# Patient Record
Sex: Female | Born: 1965 | Race: White | Hispanic: No | Marital: Married | Smoking: Former smoker
Health system: Southern US, Community
[De-identification: ages and names within clinical notes are randomized; demographics above are authoritative.]

## PROBLEM LIST (undated history)

## (undated) DIAGNOSIS — Z87442 Personal history of urinary calculi: Secondary | ICD-10-CM

## (undated) DIAGNOSIS — Z1589 Genetic susceptibility to other disease: Secondary | ICD-10-CM

## (undated) DIAGNOSIS — Z8042 Family history of malignant neoplasm of prostate: Secondary | ICD-10-CM

## (undated) DIAGNOSIS — N200 Calculus of kidney: Secondary | ICD-10-CM

## (undated) DIAGNOSIS — Z8481 Family history of carrier of genetic disease: Secondary | ICD-10-CM

## (undated) DIAGNOSIS — I10 Essential (primary) hypertension: Secondary | ICD-10-CM

## (undated) DIAGNOSIS — Z803 Family history of malignant neoplasm of breast: Secondary | ICD-10-CM

## (undated) DIAGNOSIS — D259 Leiomyoma of uterus, unspecified: Secondary | ICD-10-CM

## (undated) DIAGNOSIS — Z807 Family history of other malignant neoplasms of lymphoid, hematopoietic and related tissues: Secondary | ICD-10-CM

## (undated) DIAGNOSIS — F419 Anxiety disorder, unspecified: Secondary | ICD-10-CM

## (undated) DIAGNOSIS — K219 Gastro-esophageal reflux disease without esophagitis: Secondary | ICD-10-CM

## (undated) HISTORY — DX: Family history of malignant neoplasm of prostate: Z80.42

## (undated) HISTORY — DX: Family history of carrier of genetic disease: Z84.81

## (undated) HISTORY — PX: APPENDECTOMY: SHX54

## (undated) HISTORY — DX: Family history of other malignant neoplasms of lymphoid, hematopoietic and related tissues: Z80.7

## (undated) HISTORY — DX: Family history of malignant neoplasm of breast: Z80.3

---

## 1992-11-20 HISTORY — PX: APPENDECTOMY: SHX54

## 1999-05-03 ENCOUNTER — Emergency Department (HOSPITAL_COMMUNITY): Admission: EM | Admit: 1999-05-03 | Discharge: 1999-05-03 | Payer: Self-pay | Admitting: Emergency Medicine

## 1999-05-03 ENCOUNTER — Encounter: Payer: Self-pay | Admitting: Emergency Medicine

## 2001-06-27 ENCOUNTER — Encounter: Payer: Self-pay | Admitting: Emergency Medicine

## 2001-06-27 ENCOUNTER — Emergency Department (HOSPITAL_COMMUNITY): Admission: EM | Admit: 2001-06-27 | Discharge: 2001-06-27 | Payer: Self-pay | Admitting: Emergency Medicine

## 2001-11-20 HISTORY — PX: TUBAL LIGATION: SHX77

## 2002-10-30 ENCOUNTER — Ambulatory Visit (HOSPITAL_BASED_OUTPATIENT_CLINIC_OR_DEPARTMENT_OTHER): Admission: RE | Admit: 2002-10-30 | Discharge: 2002-10-30 | Payer: Self-pay | Admitting: Obstetrics and Gynecology

## 2008-05-10 ENCOUNTER — Emergency Department (HOSPITAL_COMMUNITY): Admission: EM | Admit: 2008-05-10 | Discharge: 2008-05-10 | Payer: Self-pay | Admitting: Emergency Medicine

## 2008-12-10 ENCOUNTER — Encounter: Admission: RE | Admit: 2008-12-10 | Discharge: 2008-12-10 | Payer: Self-pay | Admitting: Obstetrics and Gynecology

## 2008-12-15 ENCOUNTER — Encounter: Admission: RE | Admit: 2008-12-15 | Discharge: 2008-12-15 | Payer: Self-pay | Admitting: Obstetrics and Gynecology

## 2009-05-27 ENCOUNTER — Encounter: Admission: RE | Admit: 2009-05-27 | Discharge: 2009-05-27 | Payer: Self-pay | Admitting: Obstetrics and Gynecology

## 2009-08-06 ENCOUNTER — Encounter: Admission: RE | Admit: 2009-08-06 | Discharge: 2009-08-06 | Payer: Self-pay | Admitting: Family Medicine

## 2009-08-07 ENCOUNTER — Emergency Department (HOSPITAL_COMMUNITY): Admission: EM | Admit: 2009-08-07 | Discharge: 2009-08-07 | Payer: Self-pay | Admitting: Emergency Medicine

## 2009-12-13 ENCOUNTER — Encounter: Admission: RE | Admit: 2009-12-13 | Discharge: 2009-12-13 | Payer: Self-pay | Admitting: Obstetrics and Gynecology

## 2010-12-15 ENCOUNTER — Encounter
Admission: RE | Admit: 2010-12-15 | Discharge: 2010-12-15 | Payer: Self-pay | Source: Home / Self Care | Attending: Obstetrics and Gynecology | Admitting: Obstetrics and Gynecology

## 2011-02-24 LAB — DIFFERENTIAL
Basophils Absolute: 0 10*3/uL (ref 0.0–0.1)
Lymphocytes Relative: 15 % (ref 12–46)
Monocytes Absolute: 0.5 10*3/uL (ref 0.1–1.0)
Monocytes Relative: 10 % (ref 3–12)
Neutro Abs: 4.2 10*3/uL (ref 1.7–7.7)
Neutrophils Relative %: 74 % (ref 43–77)

## 2011-02-24 LAB — URINALYSIS, ROUTINE W REFLEX MICROSCOPIC
Bilirubin Urine: NEGATIVE
Hgb urine dipstick: NEGATIVE
Ketones, ur: NEGATIVE mg/dL
Protein, ur: NEGATIVE mg/dL
Specific Gravity, Urine: 1.039 — ABNORMAL HIGH (ref 1.005–1.030)
pH: 6 (ref 5.0–8.0)

## 2011-02-24 LAB — CBC
HCT: 37.7 % (ref 36.0–46.0)
MCV: 91.5 fL (ref 78.0–100.0)
Platelets: 195 10*3/uL (ref 150–400)
RDW: 12.2 % (ref 11.5–15.5)

## 2011-02-24 LAB — COMPREHENSIVE METABOLIC PANEL
Albumin: 3.2 g/dL — ABNORMAL LOW (ref 3.5–5.2)
BUN: 12 mg/dL (ref 6–23)
Creatinine, Ser: 0.5 mg/dL (ref 0.4–1.2)
Glucose, Bld: 98 mg/dL (ref 70–99)
Total Bilirubin: 0.3 mg/dL (ref 0.3–1.2)
Total Protein: 5.9 g/dL — ABNORMAL LOW (ref 6.0–8.3)

## 2011-04-07 NOTE — Op Note (Signed)
   NAME:  ALLEYAH, TWOMBLY                     ACCOUNT NO.:  [id number]   MEDICAL RECORD NO.:  [id number]                   PATIENT TYPE:  AMB   LOCATION:  [facility]                                 FACILITY:  [facility]   PHYSICIAN:  Katherine Roan, M.D.               DATE OF BIRTH:  [birth year]   DATE OF PROCEDURE:  10/30/2002  DATE OF DISCHARGE:                                 OPERATIVE REPORT   PREOPERATIVE DIAGNOSES:  Desires sterilization.   POSTOPERATIVE DIAGNOSES:  Desires sterilization.   OPERATION:  Laparoscopic tubal ligation.   DESCRIPTION OF PROCEDURE:  The patient was prepped and draped in the usual  fashion, the bladder was empted, a transverse incision was made in the  abdomen and a Veress needle was inserted and aspiration infusion was begun  without difficulty. The Hulka elevator was inserted into the abdomen. After  about 3 liters of CO2 were infused, the trocar was inserted, visualization  of the pelvis revealed two normal tubes and ovaries. The second puncture was  made in the lower midline and I cauterized both tubes and scissored them.  Gas was evacuated and the skin was closed with #0 Vicryl on a UR-6 needle  and 4-0 PDS. The incisions were infiltrated with 0.5% Marcaine with  epinephrine. The patient tolerated this procedure well and was sent to the  recovery room in good condition.                                               Katherine Roan, M.D.    SDM/MEDQ  D:  10/30/2002  T:  10/30/2002  Job:  [id number]

## 2011-11-28 ENCOUNTER — Other Ambulatory Visit: Payer: Self-pay | Admitting: Gynecology

## 2011-11-28 DIAGNOSIS — Z1231 Encounter for screening mammogram for malignant neoplasm of breast: Secondary | ICD-10-CM

## 2011-12-20 ENCOUNTER — Ambulatory Visit: Payer: Self-pay

## 2012-01-29 ENCOUNTER — Ambulatory Visit: Payer: Self-pay

## 2012-02-05 ENCOUNTER — Ambulatory Visit
Admission: RE | Admit: 2012-02-05 | Discharge: 2012-02-05 | Disposition: A | Discharge status: Home / Self Care | Payer: BC Managed Care – PPO | Source: Ambulatory Visit | Attending: Gynecology | Admitting: Gynecology

## 2012-02-05 DIAGNOSIS — Z1231 Encounter for screening mammogram for malignant neoplasm of breast: Secondary | ICD-10-CM

## 2012-02-07 ENCOUNTER — Other Ambulatory Visit: Payer: Self-pay | Admitting: Gynecology

## 2012-02-07 DIAGNOSIS — R928 Other abnormal and inconclusive findings on diagnostic imaging of breast: Secondary | ICD-10-CM

## 2012-02-14 ENCOUNTER — Ambulatory Visit
Admission: RE | Admit: 2012-02-14 | Discharge: 2012-02-14 | Disposition: A | Discharge status: Home / Self Care | Payer: BC Managed Care – PPO | Source: Ambulatory Visit | Attending: Gynecology | Admitting: Gynecology

## 2012-02-14 DIAGNOSIS — R928 Other abnormal and inconclusive findings on diagnostic imaging of breast: Secondary | ICD-10-CM

## 2013-02-11 ENCOUNTER — Other Ambulatory Visit: Payer: Self-pay

## 2013-02-11 DIAGNOSIS — Z1231 Encounter for screening mammogram for malignant neoplasm of breast: Secondary | ICD-10-CM

## 2013-03-11 ENCOUNTER — Ambulatory Visit
Admission: RE | Admit: 2013-03-11 | Discharge: 2013-03-11 | Disposition: A | Discharge status: Home / Self Care | Payer: BC Managed Care – PPO | Source: Ambulatory Visit

## 2013-03-11 DIAGNOSIS — Z1231 Encounter for screening mammogram for malignant neoplasm of breast: Secondary | ICD-10-CM

## 2014-01-26 ENCOUNTER — Other Ambulatory Visit: Payer: Self-pay

## 2014-01-26 DIAGNOSIS — Z1231 Encounter for screening mammogram for malignant neoplasm of breast: Secondary | ICD-10-CM

## 2014-03-12 ENCOUNTER — Ambulatory Visit
Admission: RE | Admit: 2014-03-12 | Discharge: 2014-03-12 | Disposition: A | Discharge status: Home / Self Care | Payer: BC Managed Care – PPO | Source: Ambulatory Visit

## 2014-03-12 DIAGNOSIS — Z1231 Encounter for screening mammogram for malignant neoplasm of breast: Secondary | ICD-10-CM

## 2014-07-24 ENCOUNTER — Emergency Department (HOSPITAL_BASED_OUTPATIENT_CLINIC_OR_DEPARTMENT_OTHER): Payer: BC Managed Care – PPO

## 2014-07-24 ENCOUNTER — Encounter (HOSPITAL_BASED_OUTPATIENT_CLINIC_OR_DEPARTMENT_OTHER): Payer: Self-pay | Admitting: Emergency Medicine

## 2014-07-24 ENCOUNTER — Emergency Department (HOSPITAL_BASED_OUTPATIENT_CLINIC_OR_DEPARTMENT_OTHER)
Admission: EM | Admit: 2014-07-24 | Discharge: 2014-07-24 | Disposition: A | Discharge status: Home / Self Care | Payer: BC Managed Care – PPO | Attending: Emergency Medicine | Admitting: Emergency Medicine

## 2014-07-24 ENCOUNTER — Encounter (HOSPITAL_COMMUNITY): Payer: Self-pay | Admitting: Emergency Medicine

## 2014-07-24 ENCOUNTER — Emergency Department (HOSPITAL_COMMUNITY)
Admission: EM | Admit: 2014-07-24 | Discharge: 2014-07-24 | Discharge status: Against Medical Advice | Payer: BC Managed Care – PPO | Attending: Emergency Medicine | Admitting: Emergency Medicine

## 2014-07-24 DIAGNOSIS — Z9089 Acquired absence of other organs: Secondary | ICD-10-CM | POA: Diagnosis not present

## 2014-07-24 DIAGNOSIS — R109 Unspecified abdominal pain: Secondary | ICD-10-CM | POA: Diagnosis present

## 2014-07-24 DIAGNOSIS — N201 Calculus of ureter: Secondary | ICD-10-CM | POA: Diagnosis not present

## 2014-07-24 DIAGNOSIS — R1031 Right lower quadrant pain: Secondary | ICD-10-CM | POA: Diagnosis present

## 2014-07-24 DIAGNOSIS — I10 Essential (primary) hypertension: Secondary | ICD-10-CM | POA: Insufficient documentation

## 2014-07-24 HISTORY — DX: Essential (primary) hypertension: I10

## 2014-07-24 LAB — URINE MICROSCOPIC-ADD ON

## 2014-07-24 LAB — COMPREHENSIVE METABOLIC PANEL WITH GFR
ALT: 24 U/L (ref 0–35)
AST: 20 U/L (ref 0–37)
Albumin: 4 g/dL (ref 3.5–5.2)
Alkaline Phosphatase: 103 U/L (ref 39–117)
Anion gap: 12 (ref 5–15)
BUN: 11 mg/dL (ref 6–23)
CO2: 25 meq/L (ref 19–32)
Calcium: 9.1 mg/dL (ref 8.4–10.5)
Chloride: 103 meq/L (ref 96–112)
Creatinine, Ser: 0.61 mg/dL (ref 0.50–1.10)
GFR calc Af Amer: >90 mL/min (ref >90)
GFR calc non Af Amer: >90 mL/min (ref >90)
Glucose, Bld: 96 mg/dL (ref 70–99)
Potassium: 3.9 meq/L (ref 3.7–5.3)
Sodium: 140 meq/L (ref 137–147)
Total Bilirubin: 0.2 mg/dL — ABNORMAL LOW (ref 0.3–1.2)
Total Protein: 7.2 g/dL (ref 6.0–8.3)

## 2014-07-24 LAB — URINALYSIS, ROUTINE W REFLEX MICROSCOPIC
GLUCOSE, UA: NEGATIVE mg/dL
Ketones, ur: NEGATIVE mg/dL
Nitrite: POSITIVE — AB
PH: 5.5 (ref 5.0–8.0)
PROTEIN: NEGATIVE mg/dL
Specific Gravity, Urine: 1.017 (ref 1.005–1.030)
Urobilinogen, UA: 1 mg/dL (ref 0.0–1.0)

## 2014-07-24 LAB — CBC WITH DIFFERENTIAL/PLATELET
Basophils Absolute: 0 K/uL (ref 0.0–0.1)
Basophils Relative: 0 % (ref 0–1)
Eosinophils Absolute: 0 K/uL (ref 0.0–0.7)
Eosinophils Relative: 1 % (ref 0–5)
HCT: 38.5 % (ref 36.0–46.0)
Hemoglobin: 13.5 g/dL (ref 12.0–15.0)
Lymphocytes Relative: 18 % (ref 12–46)
Lymphs Abs: 1.1 K/uL (ref 0.7–4.0)
MCH: 30.3 pg (ref 26.0–34.0)
MCHC: 35.1 g/dL (ref 30.0–36.0)
MCV: 86.5 fL (ref 78.0–100.0)
Monocytes Absolute: 0.4 K/uL (ref 0.1–1.0)
Monocytes Relative: 6 % (ref 3–12)
Neutro Abs: 4.7 K/uL (ref 1.7–7.7)
Neutrophils Relative %: 75 % (ref 43–77)
Platelets: 242 K/uL (ref 150–400)
RBC: 4.45 MIL/uL (ref 3.87–5.11)
RDW: 12.6 % (ref 11.5–15.5)
WBC: 6.3 K/uL (ref 4.0–10.5)

## 2014-07-24 LAB — POC URINE PREG, ED: Preg Test, Ur: NEGATIVE

## 2014-07-24 MED ORDER — OXYCODONE-ACETAMINOPHEN 5-325 MG PO TABS: 1.0000 | ORAL_TABLET | ORAL | Status: DC | PRN

## 2014-07-24 MED ORDER — OXYCODONE-ACETAMINOPHEN 5-325 MG PO TABS
1.0000 | ORAL_TABLET | Freq: Once | ORAL | Status: AC
  Administered 2014-07-24: 1 via ORAL
  Filled 2014-07-24: qty 1

## 2014-07-24 MED ORDER — ONDANSETRON HCL 4 MG PO TABS: 4.0000 mg | ORAL_TABLET | Freq: Four times a day (QID) | ORAL | Status: DC

## 2014-07-24 NOTE — ED Notes (Signed)
Pt with urinary frequency since last night.  Pt went to an UC and was started on an antibiotic this am.  When she arrived home she began having RLQ pain that radiated to her R lower back.  A vicodin reduced the pain but the pain is returning.

## 2014-07-24 NOTE — ED Provider Notes (Signed)
CSN: [id number]     Arrival date & time 07/24/14  1732 History  This chart was scribed for non-physician practitioner Charlann Lange working with Babette Relic, MD by Donato Schultz, ED Scribe. This patient was seen in room MH10/MH10 and the patient's care was started at 6:19 PM.     Chief Complaint  Patient presents with  . Flank Pain   Patient is a 48 y.o. female presenting with flank pain. The history is provided by the patient. No language interpreter was used.  Flank Pain   HPI Comments: Kendra Coleman is a 48 y.o. female who presents to the Emergency Department complaining of constant dysuria and frequency with associated back pain that started last night.  She suspected this was a UTI and took some OTC medication with no improvement in her symptoms.  She went to [facility] this morning and was treated with antibiotics for a UTI.  Later this afternoon she started experiencing non-radiating right flank pain with associated nausea.  She denies fever, hematuria, and changes in appetite as associated symptoms.  She took vicodin with relief to her flank pain and nausea.  She does not have a history of kidney stones.  Past Medical History  Diagnosis Date  . Hypertension    Past Surgical History  Procedure Laterality Date  . Appendectomy     No family history on file. History  Substance Use Topics  . Smoking status: Never Smoker   . Smokeless tobacco: Not on file  . Alcohol Use: Yes     Comment: occ   OB History   Grav Para Term Preterm Abortions TAB SAB Ect Mult Living                 Review of Systems  Constitutional: Negative for fever and appetite change.  Gastrointestinal: Positive for nausea.  Genitourinary: Positive for dysuria, frequency and flank pain. Negative for hematuria.  All other systems reviewed and are negative.     Allergies  Review of patient's allergies indicates no known allergies.  Home Medications   Prior to Admission medications   Not on  File   BP 137/88  Pulse 86  Temp(Src) 98.2 F (36.8 C) (Oral)  Resp 16  SpO2 96%  LMP 07/20/2014  Physical Exam  Nursing note and vitals reviewed. Constitutional: She is oriented to person, place, and time. She appears well-developed and well-nourished.  HENT:  Head: Normocephalic and atraumatic.  Eyes: EOM are normal.  Neck: Normal range of motion.  Cardiovascular: Normal rate.   Pulmonary/Chest: Effort normal.  Abdominal: Soft. Bowel sounds are normal. She exhibits no distension. There is no tenderness. There is no CVA tenderness.  Musculoskeletal: Normal range of motion.  Neurological: She is alert and oriented to person, place, and time.  Skin: Skin is warm and dry.  Psychiatric: She has a normal mood and affect. Her behavior is normal.    ED Course  Procedures (including critical care time)  DIAGNOSTIC STUDIES: Oxygen Saturation is 96% on room air, adequate by my interpretation.    COORDINATION OF CARE: 6:23 PM- Discussed treating the patient for a UTI and the patient agreed to the treatment plan.   Labs Review Labs Reviewed - No data to display  Imaging Review No results found.   EKG Interpretation None      MDM   Final diagnoses:  None    1. Kidney stone  Pain managed, no further vomiting. CT results show left ureteral stone at UVJ. Discussed results  with patient and family. Suggested she continue antibiotics for nitrite positive urine but is well otherwise and is stable for discharge.   I personally performed the services described in this documentation, which was scribed in my presence. The recorded information has been reviewed and is accurate.     Dewaine Oats, PA-C 07/26/14 [phone]

## 2014-07-24 NOTE — Discharge Instructions (Signed)
CONTINUE THE ANTIBIOTICS PROVIDED THIS MORNING AND TAKE AS DIRECTED UNTIL GONE. TAKE PAIN MEDICATION AND NAUSEA TREATMENT AS NEEDED. RETURN HERE WITH ANY HIGH FEVER OR UNCONTROLLED PAIN.  Kidney Stones Kidney stones (urolithiasis) are solid masses that form inside your kidneys. The intense pain is caused by the stone moving through the kidney, ureter, bladder, and urethra (urinary tract). When the stone moves, the ureter starts to spasm around the stone. The stone is usually passed in your pee (urine).  HOME CARE  Drink enough fluids to keep your pee clear or pale yellow. This helps to get the stone out.  Strain all pee through the provided strainer. Do not pee without peeing through the strainer, not even once. If you pee the stone out, catch it in the strainer. The stone may be as small as a grain of salt. Take this to your doctor. This will help your doctor figure out what you can do to try to prevent more kidney stones.  Only take medicine as told by your doctor.  Follow up with your doctor as told.  Get follow-up X-rays as told by your doctor. GET HELP IF: You have pain that gets worse even if you have been taking pain medicine. GET HELP RIGHT AWAY IF:   Your pain does not get better with medicine.  You have a fever or shaking chills.  Your pain increases and gets worse over 18 hours.  You have new belly (abdominal) pain.  You feel faint or pass out.  You are unable to pee. MAKE SURE YOU:   Understand these instructions.  Will watch your condition.  Will get help right away if you are not doing well or get worse. Document Released: 04/24/2008 Document Revised: 07/09/2013 Document Reviewed: 04/09/2013 [facility] Patient Information 2015 [place], [place]. This information is not intended to replace advice given to you by your health care provider. Make sure you discuss any questions you have with your health care provider.

## 2014-07-24 NOTE — ED Notes (Signed)
Pt reports urinary symptoms since last night. Seen at [facility] this am, took urine specimen and placed on antibiotics. States pain intensified, went to [facility], but wait was too long, so decided to come here instead. Pain in right flank area, radiating to abdomen. Has since of urgency. Reports nausea when pain hits.

## 2014-08-05 NOTE — ED Provider Notes (Signed)
Medical screening examination/treatment/procedure(s) were performed by non-physician practitioner and as supervising physician I was immediately available for consultation/collaboration.   EKG Interpretation None       Babette Relic, MD 08/05/14 2249

## 2015-02-09 ENCOUNTER — Other Ambulatory Visit: Payer: Self-pay

## 2015-02-09 DIAGNOSIS — Z1231 Encounter for screening mammogram for malignant neoplasm of breast: Secondary | ICD-10-CM

## 2015-03-23 ENCOUNTER — Ambulatory Visit
Admission: RE | Admit: 2015-03-23 | Discharge: 2015-03-23 | Disposition: A | Discharge status: Home / Self Care | Payer: 59 | Source: Ambulatory Visit

## 2015-03-23 DIAGNOSIS — Z1231 Encounter for screening mammogram for malignant neoplasm of breast: Secondary | ICD-10-CM

## 2016-02-16 ENCOUNTER — Other Ambulatory Visit: Payer: Self-pay

## 2016-02-16 DIAGNOSIS — Z1231 Encounter for screening mammogram for malignant neoplasm of breast: Secondary | ICD-10-CM

## 2016-03-02 ENCOUNTER — Ambulatory Visit
Admission: RE | Admit: 2016-03-02 | Discharge: 2016-03-02 | Disposition: A | Discharge status: Home / Self Care | Payer: 59 | Source: Ambulatory Visit | Attending: Family Medicine | Admitting: Family Medicine

## 2016-03-02 ENCOUNTER — Other Ambulatory Visit: Payer: Self-pay | Admitting: Family Medicine

## 2016-03-02 DIAGNOSIS — R059 Cough, unspecified: Secondary | ICD-10-CM

## 2016-03-02 DIAGNOSIS — R05 Cough: Secondary | ICD-10-CM

## 2016-03-28 ENCOUNTER — Ambulatory Visit
Admission: RE | Admit: 2016-03-28 | Discharge: 2016-03-28 | Disposition: A | Discharge status: Home / Self Care | Payer: 59 | Source: Ambulatory Visit

## 2016-03-28 DIAGNOSIS — Z1231 Encounter for screening mammogram for malignant neoplasm of breast: Secondary | ICD-10-CM

## 2016-12-09 ENCOUNTER — Emergency Department (HOSPITAL_BASED_OUTPATIENT_CLINIC_OR_DEPARTMENT_OTHER): Payer: 59

## 2016-12-09 ENCOUNTER — Encounter (HOSPITAL_BASED_OUTPATIENT_CLINIC_OR_DEPARTMENT_OTHER): Payer: Self-pay | Admitting: *Deleted

## 2016-12-09 ENCOUNTER — Emergency Department (HOSPITAL_BASED_OUTPATIENT_CLINIC_OR_DEPARTMENT_OTHER)
Admission: EM | Admit: 2016-12-09 | Discharge: 2016-12-09 | Disposition: A | Discharge status: Home / Self Care | Payer: 59 | Attending: Emergency Medicine | Admitting: Emergency Medicine

## 2016-12-09 DIAGNOSIS — I10 Essential (primary) hypertension: Secondary | ICD-10-CM | POA: Insufficient documentation

## 2016-12-09 DIAGNOSIS — Z79899 Other long term (current) drug therapy: Secondary | ICD-10-CM | POA: Insufficient documentation

## 2016-12-09 DIAGNOSIS — R109 Unspecified abdominal pain: Secondary | ICD-10-CM | POA: Diagnosis present

## 2016-12-09 DIAGNOSIS — N200 Calculus of kidney: Secondary | ICD-10-CM | POA: Insufficient documentation

## 2016-12-09 HISTORY — DX: Calculus of kidney: N20.0

## 2016-12-09 LAB — CBC
HCT: 40.4 % (ref 36.0–46.0)
Hemoglobin: 13.4 g/dL (ref 12.0–15.0)
MCH: 29.6 pg (ref 26.0–34.0)
MCHC: 33.2 g/dL (ref 30.0–36.0)
MCV: 89.4 fL (ref 78.0–100.0)
Platelets: 233 10*3/uL (ref 150–400)
RBC: 4.52 MIL/uL (ref 3.87–5.11)
RDW: 12.9 % (ref 11.5–15.5)
WBC: 10.1 10*3/uL (ref 4.0–10.5)

## 2016-12-09 LAB — BASIC METABOLIC PANEL
ANION GAP: 6 (ref 5–15)
BUN: 15 mg/dL (ref 6–20)
CALCIUM: 9.1 mg/dL (ref 8.9–10.3)
CO2: 26 mmol/L (ref 22–32)
Chloride: 107 mmol/L (ref 101–111)
Creatinine, Ser: 0.95 mg/dL (ref 0.44–1.00)
GFR calc Af Amer: >60 mL/min (ref >60)
Glucose, Bld: 117 mg/dL — ABNORMAL HIGH (ref 65–99)
Potassium: 3.9 mmol/L (ref 3.5–5.1)
Sodium: 139 mmol/L (ref 135–145)

## 2016-12-09 LAB — URINALYSIS, ROUTINE W REFLEX MICROSCOPIC
Glucose, UA: NEGATIVE mg/dL
HGB URINE DIPSTICK: NEGATIVE
Ketones, ur: 40 mg/dL — AB
Nitrite: POSITIVE — AB
Protein, ur: 30 mg/dL — AB
Specific Gravity, Urine: 1.038 — ABNORMAL HIGH (ref 1.005–1.030)
pH: 5 (ref 5.0–8.0)

## 2016-12-09 LAB — URINALYSIS, MICROSCOPIC (REFLEX): RBC / HPF: NONE SEEN RBC/hpf (ref 0–5)

## 2016-12-09 MED ORDER — HYDROCODONE-ACETAMINOPHEN 5-325 MG PO TABS: 1.0000 | ORAL_TABLET | ORAL | 0 refills | Status: DC | PRN

## 2016-12-09 MED ORDER — KETOROLAC TROMETHAMINE 15 MG/ML IJ SOLN
15.0000 mg | Freq: Once | INTRAMUSCULAR | Status: AC
  Administered 2016-12-09: 15 mg via INTRAVENOUS
  Filled 2016-12-09: qty 1

## 2016-12-09 MED ORDER — MORPHINE SULFATE (PF) 4 MG/ML IV SOLN
4.0000 mg | Freq: Once | INTRAVENOUS | Status: AC
  Administered 2016-12-09: 4 mg via INTRAVENOUS
  Filled 2016-12-09: qty 1

## 2016-12-09 MED ORDER — ONDANSETRON HCL 4 MG PO TABS: 4.0000 mg | ORAL_TABLET | Freq: Four times a day (QID) | ORAL | 0 refills | Status: DC

## 2016-12-09 MED ORDER — ONDANSETRON HCL 4 MG/2ML IJ SOLN
4.0000 mg | Freq: Once | INTRAMUSCULAR | Status: AC
  Administered 2016-12-09: 4 mg via INTRAVENOUS
  Filled 2016-12-09: qty 2

## 2016-12-09 MED ORDER — SODIUM CHLORIDE 0.9 % IV BOLUS (SEPSIS)
1000.0000 mL | Freq: Once | INTRAVENOUS | Status: AC
  Administered 2016-12-09: 1000 mL via INTRAVENOUS

## 2016-12-09 MED ORDER — TAMSULOSIN HCL 0.4 MG PO CAPS: 0.4000 mg | ORAL_CAPSULE | Freq: Every day | ORAL | 0 refills | Status: DC

## 2016-12-09 MED ORDER — HYDROMORPHONE HCL 1 MG/ML IJ SOLN
0.5000 mg | Freq: Once | INTRAMUSCULAR | Status: AC
  Administered 2016-12-09: 0.5 mg via INTRAVENOUS
  Filled 2016-12-09: qty 1

## 2016-12-09 NOTE — ED Triage Notes (Signed)
Patient states she was woke up with left flank pain with radiation into the left groin.  Pain is associated with urinary urgency, and nausea and vomiting x 1.  History of kidney stones.

## 2016-12-09 NOTE — ED Notes (Signed)
ED Provider at bedside. 

## 2016-12-09 NOTE — Discharge Instructions (Signed)
Your CAT scan shows a 3 mm kidney stone in the left side. This is likely to pass on its own. I will give you a short course of pain medicine and nausea medicine. Please take the Flomax once a day. He may also take ibuprofen or Motrin starting later this afternoon. Follow up with urology as listed with the referral. If you develops any fevers, uncontrolled vomiting, worsening pain, abdominal pain please return to the ED.

## 2016-12-10 NOTE — ED Provider Notes (Signed)
[facility] Provider Note   CSN: [id number] Arrival date & time: 12/09/16  [id number]     History   Chief Complaint Chief Complaint  Patient presents with  . Flank Pain    left    HPI Kendra Coleman is a 51 y.o. female.  51 year old Caucasian female past medical history significant for nephrolithiasis presents to the ED today with complaints of left flank pain. States the pain started this a.m. She states the pain radiates to her left groin. Patient has history of kidney stones states this feels similar. She is not taking any home for the pain prior to arrival. Nothing makes better or worse. Patient states the pain is intermittent and sharp. Patient also endorses urinary urgency and frequency. She denies any dysuria or hematuria. Patient also reports nausea and one episode of emesis prior to arrival She denies any fever, chills, headache, vision changes, lightheadedness, dizziness, shortness of breath, abdominal pain, change in bowel habits, paresthesias. Patient does endorse taking Azo prior to arrival.      Past Medical History:  Diagnosis Date  . Hypertension   . Kidney calculi     There are no active problems to display for this patient.   Past Surgical History:  Procedure Laterality Date  . APPENDECTOMY      OB History    No data available       Home Medications    Prior to Admission medications   Medication Sig Start Date End Date Taking? Authorizing Provider  metoprolol (LOPRESSOR) 100 MG tablet Take 100 mg by mouth 2 (two) times daily.   Yes Historical Provider, MD  HYDROcodone-acetaminophen (NORCO/VICODIN) 5-325 MG tablet Take 1-2 tablets by mouth every 4 (four) hours as needed. 12/09/16   Doristine Devoid, PA-C  ondansetron (ZOFRAN) 4 MG tablet Take 1 tablet (4 mg total) by mouth every 6 (six) hours. 12/09/16   Doristine Devoid, PA-C  oxyCODONE-acetaminophen (PERCOCET/ROXICET) 5-325 MG per tablet Take 1-2 tablets by mouth every 4 (four)  hours as needed for severe pain. 07/24/14   Charlann Lange, PA-C  tamsulosin (FLOMAX) 0.4 MG CAPS capsule Take 1 capsule (0.4 mg total) by mouth daily. 12/09/16   Doristine Devoid, PA-C    Family History No family history on file.  Social History Social History  Substance Use Topics  . Smoking status: Never Smoker  . Smokeless tobacco: Never Used  . Alcohol use Yes     Comment: occ     Allergies   Patient has no known allergies.   Review of Systems Review of Systems  Constitutional: Negative for chills and fever.  HENT: Negative for congestion.   Gastrointestinal: Negative for abdominal pain, diarrhea, nausea and vomiting.  Genitourinary: Positive for flank pain (left), frequency and urgency. Negative for dysuria and hematuria.  Musculoskeletal: Positive for back pain (left flank).  Skin: Negative.   Neurological: Negative for dizziness, syncope, weakness, light-headedness, numbness and headaches.  All other systems reviewed and are negative.    Physical Exam Updated Vital Signs BP 141/83 (BP Location: Right Arm)   Pulse 82   Temp 98.7 F (37.1 C) (Oral)   Resp 18   Ht 5\' 7"  (1.702 m)   Wt 89.8 kg   LMP 11/26/2016   SpO2 97%   BMI 31.01 kg/m   Physical Exam  Constitutional: She appears well-developed and well-nourished. She appears distressed (pain).  Patient appears to be in mild distress due to pain. She is pacing around the room holding her  left side.  HENT:  Head: Normocephalic and atraumatic.  Mouth/Throat: Oropharynx is clear and moist.  Eyes: Conjunctivae are normal. Right eye exhibits no discharge. Left eye exhibits no discharge. No scleral icterus.  Neck: Normal range of motion. Neck supple. No thyromegaly present.  Cardiovascular: Normal rate, regular rhythm, normal heart sounds and intact distal pulses.   Pulmonary/Chest: Effort normal and breath sounds normal.  Abdominal: Soft. Bowel sounds are normal. She exhibits no distension. There is no  tenderness. There is CVA tenderness (left). There is no rigidity, no rebound and no guarding.  Musculoskeletal: Normal range of motion.  Lymphadenopathy:    She has no cervical adenopathy.  Neurological: She is alert.  Skin: Skin is warm and dry. Capillary refill takes less than 2 seconds.  Nursing note and vitals reviewed.    ED Treatments / Results  Labs (all labs ordered are listed, but only abnormal results are displayed) Labs Reviewed  URINALYSIS, ROUTINE W REFLEX MICROSCOPIC - Abnormal; Notable for the following:       Result Value   Color, Urine RED (*)    APPearance CLOUDY (*)    Specific Gravity, Urine 1.038 (*)    Bilirubin Urine MODERATE (*)    Ketones, ur 40 (*)    Protein, ur 30 (*)    Nitrite POSITIVE (*)    Leukocytes, UA MODERATE (*)    All other components within normal limits  URINALYSIS, MICROSCOPIC (REFLEX) - Abnormal; Notable for the following:    Bacteria, UA FEW (*)    Squamous Epithelial / LPF 6-30 (*)    All other components within normal limits  BASIC METABOLIC PANEL - Abnormal; Notable for the following:    Glucose, Bld 117 (*)    All other components within normal limits  URINE CULTURE  CBC    EKG  EKG Interpretation None       Radiology Ct Renal Stone Study  Result Date: 12/09/2016 CLINICAL DATA:  Left flank pain EXAM: CT ABDOMEN AND PELVIS WITHOUT CONTRAST TECHNIQUE: Multidetector CT imaging of the abdomen and pelvis was performed following the standard protocol without IV contrast. COMPARISON:  07/24/2014 FINDINGS: Lower chest: No acute abnormality. Hepatobiliary: No focal liver abnormality. There is a stone within the gallbladder measuring 2.3 cm, image 25 of series 2. Pancreas: Unremarkable. No pancreatic ductal dilatation or surrounding inflammatory changes. Spleen: Normal appearance of the spleen. Adrenals/Urinary Tract: The adrenal glands are unremarkable. Normal appearance of the right kidney. No mass or hydronephrosis. There is  asymmetric left-sided nephromegaly and pelvocaliectasis. Inferior pole calculus is identified measuring 4 mm, image number 42 of series 2. There is left-sided hydroureter. Stone in the distal ureter measures 3 mm, image 55 of series 4. Stomach/Bowel: The stomach is normal. The small bowel loops have a normal course and caliber. No bowel obstruction. No pathologic dilatation of the colon. Vascular/Lymphatic: Normal appearance of the abdominal aorta. No aneurysm. No upper abdominal or pelvic adenopathy. Reproductive: There are several uterine fibroids identified. The largest arises from the fundus and measures 3 cm. No adnexal mass identified. Other: No abdominal wall hernia or abnormality. No abdominopelvic ascites. Musculoskeletal: No acute or significant osseous findings. IMPRESSION: 1. Distal left ureteral calculus measures 3 mm and results in left-sided nephromegaly and pelvocaliectasis. Electronically Signed   By: Kerby Moors M.D.   On: 12/09/2016 12:29    Procedures Procedures (including critical care time)  Medications Ordered in ED Medications  morphine 4 MG/ML injection 4 mg (4 mg Intravenous Given 12/09/16 1156)  ondansetron [facility]) injection 4 mg (4 mg Intravenous Given 12/09/16 1156)  sodium chloride 0.9 % bolus 1,000 mL (0 mLs Intravenous Stopped 12/09/16 1315)  ketorolac (TORADOL) 15 MG/ML injection 15 mg (15 mg Intravenous Given 12/09/16 1311)  HYDROmorphone (DILAUDID) injection 0.5 mg (0.5 mg Intravenous Given 12/09/16 1311)     Initial Impression / Assessment and Plan / ED Course  I have reviewed the triage vital signs and the nursing notes.  Pertinent labs & imaging results that were available during my care of the patient were reviewed by me and considered in my medical decision making (see chart for details).    Pt has been diagnosed with a Kidney Stone via CT. There is no evidence of significant hydronephrosis, serum creatine WNL, vitals sign stable and the pt does not have  irratractable vomiting. Urine shows significant abnormalities including color, protein, ketones, bilirubin, nitrites, leukocytes. She only has few bacteria. Patient with a significant amount of squamous epithelium. Patient does admit to taking Azo today. This is likely affecting the reading of the UA. Patient is afebrile and not tachycardic. We'll culture urine and not treat at this time. Stone is 3 mm will likely pass on its own. She was given Toradol, nausea medicine, Flomax. Patient has significant relief in her symptoms. I have given her follow-up with urology. Pt will be dc home with pain medications & has been advised to follow up with PCP. Stric return precautions dicussed. Pt dicussed with Dr. Canary Brim who is agreeable to the above plan.    Final Clinical Impressions(s) / ED Diagnoses   Final diagnoses:  Left flank pain  Nephrolithiasis    New Prescriptions Discharge Medication List as of 12/09/2016  1:24 PM    START taking these medications   Details  HYDROcodone-acetaminophen (NORCO/VICODIN) 5-325 MG tablet Take 1-2 tablets by mouth every 4 (four) hours as needed., Starting Sat 12/09/2016, Print    tamsulosin (FLOMAX) 0.4 MG CAPS capsule Take 1 capsule (0.4 mg total) by mouth daily., Starting Sat 12/09/2016, Print         Doristine Devoid, PA-C 12/12/16 0214    Alfonzo Beers, MD 12/13/16 [phone]

## 2016-12-12 LAB — URINE CULTURE

## 2016-12-13 ENCOUNTER — Telehealth (HOSPITAL_BASED_OUTPATIENT_CLINIC_OR_DEPARTMENT_OTHER): Payer: Self-pay | Admitting: Emergency Medicine

## 2016-12-13 NOTE — Progress Notes (Signed)
ED Antimicrobial Stewardship Positive Culture Follow Up   Kendra Coleman is an 51 y.o. female who presented to [facility] on 12/09/2016 with a chief complaint of  Chief Complaint  Patient presents with  . Flank Pain    left    Recent Results (from the past 720 hour(s))  Urine culture     Status: Abnormal   Collection Time: 12/09/16 11:00 AM  Result Value Ref Range Status   Specimen Description URINE, RANDOM  Final   Special Requests NONE  Final   Culture >=100,000 COLONIES/mL ENTEROCOCCUS FAECALIS (A)  Final   Report Status 12/12/2016 FINAL  Final   Organism ID, Bacteria ENTEROCOCCUS FAECALIS (A)  Final      Susceptibility   Enterococcus faecalis - MIC*    AMPICILLIN <=2 SENSITIVE Sensitive     LEVOFLOXACIN 1 SENSITIVE Sensitive     NITROFURANTOIN <=16 SENSITIVE Sensitive     VANCOMYCIN 2 SENSITIVE Sensitive     * >=100,000 COLONIES/mL ENTEROCOCCUS FAECALIS   [x]  Patient discharged originally without antimicrobial agent and treatment is now indicated  New antibiotic prescription: Amoxicillin 500mg  PO BID x 10 days  ED Provider: Quincy Carnes, PA-C  [place] Levo [facility] 12/13/2016, 9:01 AM Infectious Diseases Pharmacist Phone# [phone]

## 2016-12-13 NOTE — Telephone Encounter (Signed)
Post ED Visit - Positive Culture Follow-up: Successful Patient Follow-Up  Culture assessed and recommendations reviewed by: []  Elenor Quinones, Pharm.D. []  Heide Guile, Pharm.D., BCPS []  Parks Neptune, Pharm.D. []  Alycia Rossetti, Pharm.D., BCPS []  Carrizales, [place].D., BCPS, AAHIVP []  Legrand Como, Pharm.D., BCPS, AAHIVP []  Milus Glazier, Pharm.D. []  Stephens November, Pharm.Margarette Asal PharmD  Positive urine culture  [x]  Patient discharged without antimicrobial prescription and treatment is now indicated []  Organism is resistant to prescribed ED discharge antimicrobial []  Patient with positive blood cultures  Changes discussed with ED provider: Quincy Carnes PA New antibiotic prescription start Amoxicillin 500mg  po bid x 10 days  Attempting to contact patient   Hazle Nordmann 12/13/2016, 1:35 PM

## 2016-12-24 ENCOUNTER — Encounter (HOSPITAL_COMMUNITY): Payer: Self-pay | Admitting: Nurse Practitioner

## 2016-12-24 ENCOUNTER — Emergency Department (HOSPITAL_COMMUNITY): Payer: 59

## 2016-12-24 ENCOUNTER — Emergency Department (HOSPITAL_COMMUNITY)
Admission: EM | Admit: 2016-12-24 | Discharge: 2016-12-25 | Disposition: A | Discharge status: Home / Self Care | Payer: 59 | Attending: Emergency Medicine | Admitting: Emergency Medicine

## 2016-12-24 DIAGNOSIS — R1013 Epigastric pain: Secondary | ICD-10-CM

## 2016-12-24 DIAGNOSIS — I10 Essential (primary) hypertension: Secondary | ICD-10-CM | POA: Insufficient documentation

## 2016-12-24 DIAGNOSIS — R079 Chest pain, unspecified: Secondary | ICD-10-CM | POA: Diagnosis present

## 2016-12-24 DIAGNOSIS — K2970 Gastritis, unspecified, without bleeding: Secondary | ICD-10-CM | POA: Diagnosis not present

## 2016-12-24 LAB — CBC
HEMATOCRIT: 38.7 % (ref 36.0–46.0)
Hemoglobin: 13.3 g/dL (ref 12.0–15.0)
MCH: 29.6 pg (ref 26.0–34.0)
MCHC: 34.4 g/dL (ref 30.0–36.0)
MCV: 86.2 fL (ref 78.0–100.0)
Platelets: 279 10*3/uL (ref 150–400)
RBC: 4.49 MIL/uL (ref 3.87–5.11)
RDW: 13 % (ref 11.5–15.5)
WBC: 7.4 10*3/uL (ref 4.0–10.5)

## 2016-12-24 LAB — BASIC METABOLIC PANEL
ANION GAP: 10 (ref 5–15)
BUN: 20 mg/dL (ref 6–20)
CHLORIDE: 105 mmol/L (ref 101–111)
CO2: 24 mmol/L (ref 22–32)
Calcium: 9.1 mg/dL (ref 8.9–10.3)
Creatinine, Ser: 0.72 mg/dL (ref 0.44–1.00)
GFR calc non Af Amer: >60 mL/min (ref >60)
Glucose, Bld: 103 mg/dL — ABNORMAL HIGH (ref 65–99)
POTASSIUM: 3.6 mmol/L (ref 3.5–5.1)
SODIUM: 139 mmol/L (ref 135–145)

## 2016-12-24 LAB — I-STAT TROPONIN, ED: Troponin i, poc: 0 ng/mL (ref 0.00–0.08)

## 2016-12-24 MED ORDER — HYDROMORPHONE HCL 1 MG/ML IJ SOLN
0.5000 mg | Freq: Once | INTRAMUSCULAR | Status: AC
  Administered 2016-12-25: 0.5 mg via INTRAVENOUS
  Filled 2016-12-24: qty 1

## 2016-12-24 MED ORDER — GI COCKTAIL ~~LOC~~
30.0000 mL | Freq: Once | ORAL | Status: AC
  Administered 2016-12-24: 30 mL via ORAL
  Filled 2016-12-24: qty 30

## 2016-12-24 MED ORDER — ONDANSETRON HCL 4 MG/2ML IJ SOLN
4.0000 mg | Freq: Once | INTRAMUSCULAR | Status: AC
  Administered 2016-12-24: 4 mg via INTRAVENOUS
  Filled 2016-12-24: qty 2

## 2016-12-24 NOTE — ED Provider Notes (Signed)
[facility] DEPT Provider Note   CSN: [id number] Arrival date & time: 12/24/16  2154  By signing my name below, I, Kendra Coleman, attest that this documentation has been prepared under the direction and in the presence of Charlann Lange, [place]. Electronically Signed: Oleh Coleman, Scribe. 12/24/16. 11:15 PM.   History   Chief Complaint Chief Complaint  Patient presents with  . Back Pain  . Chest Pain    HPI Kendra Coleman is a 51 y.o. female with history of hypertension and nephrolithiasis who presents to the ED for evaluation of abdominal pain and chest pain. This patient states that 4 days ago she was lying in bed when she initially developed epigastric versus substernal pain which extends into her right back. Her pain resolved shortly thereafter but recurred several hours ago and presents to the emergency department. Not worse on palpation or following meals. She does report some positional relief in fetal position. She is reporting nausea without vomiting. Patient has previously underwent appendectomy.   The history is provided by the patient. No language interpreter was used.    Past Medical History:  Diagnosis Date  . Hypertension   . Kidney calculi     There are no active problems to display for this patient.   Past Surgical History:  Procedure Laterality Date  . APPENDECTOMY      OB History    No data available       Home Medications    Prior to Admission medications   Medication Sig Start Date End Date Taking? Authorizing Provider  HYDROcodone-acetaminophen (NORCO/VICODIN) 5-325 MG tablet Take 1-2 tablets by mouth every 4 (four) hours as needed. 12/09/16   Doristine Devoid, PA-C  metoprolol (LOPRESSOR) 100 MG tablet Take 100 mg by mouth 2 (two) times daily.    Historical Provider, MD  ondansetron (ZOFRAN) 4 MG tablet Take 1 tablet (4 mg total) by mouth every 6 (six) hours. 12/09/16   Doristine Devoid, PA-C  oxyCODONE-acetaminophen  (PERCOCET/ROXICET) 5-325 MG per tablet Take 1-2 tablets by mouth every 4 (four) hours as needed for severe pain. 07/24/14   Charlann Lange, PA-C  tamsulosin (FLOMAX) 0.4 MG CAPS capsule Take 1 capsule (0.4 mg total) by mouth daily. 12/09/16   Doristine Devoid, PA-C    Family History History reviewed. No pertinent family history.  Social History Social History  Substance Use Topics  . Smoking status: Never Smoker  . Smokeless tobacco: Never Used  . Alcohol use Yes     Comment: occ     Allergies   Patient has no known allergies.   Review of Systems Review of Systems  Constitutional: Negative for fever.  Respiratory: Negative for cough and shortness of breath.   Cardiovascular: Positive for chest pain.  Gastrointestinal: Positive for abdominal pain and nausea. Negative for vomiting.  Musculoskeletal: Positive for back pain.  Neurological: Negative for weakness.     Physical Exam Updated Vital Signs BP (!) 168/113 (BP Location: Left Arm)   Pulse 79   Temp 98.2 F (36.8 C) (Oral)   Resp 20   Ht 5\' 7"  (1.702 m)   Wt 198 lb (89.8 kg)   LMP 11/26/2016   SpO2 99%   BMI 31.01 kg/m   Physical Exam  Constitutional: She is oriented to person, place, and time. She appears well-developed and well-nourished.  Appears uncomfortable.   Neck: Neck supple.  Cardiovascular: Normal rate, regular rhythm and normal heart sounds.   Pulmonary/Chest: Effort normal and breath sounds normal. No  respiratory distress.  Abdominal: Soft. She exhibits no distension. There is no tenderness.  Musculoskeletal: Normal range of motion.  Neurological: She is alert and oriented to person, place, and time.  Skin: Skin is warm and dry.     ED Treatments / Results  DIAGNOSTIC STUDIES: Oxygen Saturation is 99 percent on room air which is normal by my interpretation.    COORDINATION OF CARE: 11:15 PM Discussed treatment plan with pt at bedside and pt agreed to plan.  Labs (all labs ordered are  listed, but only abnormal results are displayed) Labs Reviewed  BASIC METABOLIC PANEL  CBC  I-STAT [place], ED   Results for orders placed or performed during the hospital encounter of [id number]  Basic metabolic panel  Result Value Ref Range   Sodium 139 135 - 145 mmol/L   Potassium 3.6 3.5 - 5.1 mmol/L   Chloride 105 101 - 111 mmol/L   CO2 24 22 - 32 mmol/L   Glucose, Bld 103 (H) 65 - 99 mg/dL   BUN 20 6 - 20 mg/dL   Creatinine, Ser 0.72 0.44 - 1.00 mg/dL   Calcium 9.1 8.9 - 10.3 mg/dL   GFR calc non Af Amer >60 >60 mL/min   GFR calc Af Amer >60 >60 mL/min   Anion gap 10 5 - 15  CBC  Result Value Ref Range   WBC 7.4 4.0 - 10.5 K/uL   RBC 4.49 3.87 - 5.11 MIL/uL   Hemoglobin 13.3 12.0 - 15.0 g/dL   HCT 38.7 36.0 - 46.0 %   MCV 86.2 78.0 - 100.0 fL   MCH 29.6 26.0 - 34.0 pg   MCHC 34.4 30.0 - 36.0 g/dL   RDW 13.0 11.5 - 15.5 %   Platelets 279 150 - 400 K/uL  Lipase, blood  Result Value Ref Range   Lipase 36 11 - 51 U/L  Hepatic function panel  Result Value Ref Range   Total Protein 7.8 6.5 - 8.1 g/dL   Albumin 4.4 3.5 - 5.0 g/dL   AST 22 15 - 41 U/L   ALT 20 14 - 54 U/L   Alkaline Phosphatase 112 38 - 126 U/L   Total Bilirubin 0.4 0.3 - 1.2 mg/dL   Bilirubin, Direct 0.2 0.1 - 0.5 mg/dL   Indirect Bilirubin 0.2 (L) 0.3 - 0.9 mg/dL  I-stat troponin, ED  Result Value Ref Range   Troponin i, poc 0.00 0.00 - 0.08 ng/mL   Comment 3            EKG  EKG Interpretation None       Radiology No results found. Dg Chest 2 View  Result Date: 12/24/2016 CLINICAL DATA:  Mid chest pain radiating to back beginning tonight. History of hypertension. EXAM: CHEST  2 VIEW COMPARISON:  Chest radiograph March 02, 2016 FINDINGS: Cardiomediastinal silhouette is normal. No pleural effusions or focal consolidations. Trachea projects midline and there is no pneumothorax. Soft tissue planes and included osseous structures are non-suspicious. IMPRESSION: Normal chest. Electronically  Signed   By: Elon Alas M.D.   On: 12/24/2016 22:58   Ct Renal Stone Study  Result Date: 12/09/2016 CLINICAL DATA:  Left flank pain EXAM: CT ABDOMEN AND PELVIS WITHOUT CONTRAST TECHNIQUE: Multidetector CT imaging of the abdomen and pelvis was performed following the standard protocol without IV contrast. COMPARISON:  07/24/2014 FINDINGS: Lower chest: No acute abnormality. Hepatobiliary: No focal liver abnormality. There is a stone within the gallbladder measuring 2.3 cm, image 25 of series 2. Pancreas:  Unremarkable. No pancreatic ductal dilatation or surrounding inflammatory changes. Spleen: Normal appearance of the spleen. Adrenals/Urinary Tract: The adrenal glands are unremarkable. Normal appearance of the right kidney. No mass or hydronephrosis. There is asymmetric left-sided nephromegaly and pelvocaliectasis. Inferior pole calculus is identified measuring 4 mm, image number 42 of series 2. There is left-sided hydroureter. Stone in the distal ureter measures 3 mm, image 55 of series 4. Stomach/Bowel: The stomach is normal. The small bowel loops have a normal course and caliber. No bowel obstruction. No pathologic dilatation of the colon. Vascular/Lymphatic: Normal appearance of the abdominal aorta. No aneurysm. No upper abdominal or pelvic adenopathy. Reproductive: There are several uterine fibroids identified. The largest arises from the fundus and measures 3 cm. No adnexal mass identified. Other: No abdominal wall hernia or abnormality. No abdominopelvic ascites. Musculoskeletal: No acute or significant osseous findings. IMPRESSION: 1. Distal left ureteral calculus measures 3 mm and results in left-sided nephromegaly and pelvocaliectasis. Electronically Signed   By: Kerby Moors M.D.   On: 12/09/2016 12:29    Procedures Procedures (including critical care time)  Medications Ordered in ED Medications - No data to display   Initial Impression / Assessment and Plan / ED Course  I have  reviewed the triage vital signs and the nursing notes.  Pertinent labs & imaging results that were available during my care of the patient were reviewed by me and considered in my medical decision making (see chart for details).     Patient with epigastric abdominal pain radiating to right and around to right back. No aggravating or alleviating factors. Single OTC Zantac without relief. Labs are unremarkable. No TTP of abdomen, specifically, no RUQ tenderness. No indication for gall stones. Likely gastritis. IV protonix provided and will recommend Prilosec on discharge. Pain is significantly improved with Dilaudid and she is considered stable for discharge home.   Final Clinical Impressions(s) / ED Diagnoses   Final diagnoses:  None   1. Gastritis  New Prescriptions New Prescriptions   No medications on file  I personally performed the services described in this documentation, which was scribed in my presence. The recorded information has been reviewed and is accurate.     Charlann Lange, PA-C 12/25/16 [place], MD 12/25/16 [phone]

## 2016-12-24 NOTE — ED Triage Notes (Signed)
Patient states she began having mid sternum chest pain that shoots around the right side under breast around to her lower back area. States she was treated for a kidney stone about 2 weeks ago but this pain is different than what she was experiencing then. She had some left over hydrocodone which she took at 5pm and that only resolved for about 30 min and the pain was back. Denies any light headed, dizziness, or shortness of breath. States it is like nagging throbbing rib pain and worst when she lays still activity sort of resolves the pain. Denies any heavy lifting or sudden movements.

## 2016-12-25 LAB — HEPATIC FUNCTION PANEL
ALBUMIN: 4.4 g/dL (ref 3.5–5.0)
ALT: 20 U/L (ref 14–54)
AST: 22 U/L (ref 15–41)
Alkaline Phosphatase: 112 U/L (ref 38–126)
BILIRUBIN INDIRECT: 0.2 mg/dL — AB (ref 0.3–0.9)
Bilirubin, Direct: 0.2 mg/dL (ref 0.1–0.5)
TOTAL PROTEIN: 7.8 g/dL (ref 6.5–8.1)
Total Bilirubin: 0.4 mg/dL (ref 0.3–1.2)

## 2016-12-25 LAB — LIPASE, BLOOD: LIPASE: 36 U/L (ref 11–51)

## 2016-12-25 MED ORDER — PANTOPRAZOLE SODIUM 40 MG IV SOLR
40.0000 mg | Freq: Once | INTRAVENOUS | Status: AC
  Administered 2016-12-25: 40 mg via INTRAVENOUS
  Filled 2016-12-25: qty 40

## 2016-12-25 MED ORDER — OMEPRAZOLE 20 MG PO CPDR: 20.0000 mg | DELAYED_RELEASE_CAPSULE | Freq: Every day | ORAL | 0 refills | Status: AC

## 2017-04-12 ENCOUNTER — Other Ambulatory Visit: Payer: Self-pay | Admitting: Physician Assistant

## 2017-04-12 DIAGNOSIS — R1011 Right upper quadrant pain: Secondary | ICD-10-CM

## 2017-04-13 ENCOUNTER — Observation Stay (HOSPITAL_COMMUNITY)
Admission: EM | Admit: 2017-04-13 | Discharge: 2017-04-14 | Disposition: A | Discharge status: Home / Self Care | Payer: 59 | Attending: General Surgery | Admitting: General Surgery

## 2017-04-13 ENCOUNTER — Ambulatory Visit
Admission: RE | Admit: 2017-04-13 | Discharge: 2017-04-13 | Disposition: A | Discharge status: Home / Self Care | Payer: 59 | Source: Ambulatory Visit | Attending: Physician Assistant | Admitting: Physician Assistant

## 2017-04-13 ENCOUNTER — Emergency Department (HOSPITAL_COMMUNITY): Payer: 59 | Admitting: Anesthesiology

## 2017-04-13 ENCOUNTER — Encounter (HOSPITAL_COMMUNITY): Payer: Self-pay

## 2017-04-13 ENCOUNTER — Encounter (HOSPITAL_COMMUNITY)
Admission: EM | Disposition: A | Discharge status: Home / Self Care | Payer: Self-pay | Source: Home / Self Care | Attending: Emergency Medicine

## 2017-04-13 ENCOUNTER — Emergency Department (HOSPITAL_COMMUNITY): Payer: 59

## 2017-04-13 DIAGNOSIS — I10 Essential (primary) hypertension: Secondary | ICD-10-CM | POA: Insufficient documentation

## 2017-04-13 DIAGNOSIS — R109 Unspecified abdominal pain: Secondary | ICD-10-CM | POA: Diagnosis present

## 2017-04-13 DIAGNOSIS — K219 Gastro-esophageal reflux disease without esophagitis: Secondary | ICD-10-CM | POA: Diagnosis not present

## 2017-04-13 DIAGNOSIS — Z79899 Other long term (current) drug therapy: Secondary | ICD-10-CM | POA: Insufficient documentation

## 2017-04-13 DIAGNOSIS — Z9049 Acquired absence of other specified parts of digestive tract: Secondary | ICD-10-CM

## 2017-04-13 DIAGNOSIS — K8012 Calculus of gallbladder with acute and chronic cholecystitis without obstruction: Secondary | ICD-10-CM | POA: Diagnosis not present

## 2017-04-13 DIAGNOSIS — K810 Acute cholecystitis: Secondary | ICD-10-CM

## 2017-04-13 DIAGNOSIS — R1011 Right upper quadrant pain: Secondary | ICD-10-CM

## 2017-04-13 HISTORY — PX: CHOLECYSTECTOMY: SHX55

## 2017-04-13 LAB — CBC
HEMATOCRIT: 39.1 % (ref 36.0–46.0)
HEMOGLOBIN: 13.1 g/dL (ref 12.0–15.0)
MCH: 29.6 pg (ref 26.0–34.0)
MCHC: 33.5 g/dL (ref 30.0–36.0)
MCV: 88.3 fL (ref 78.0–100.0)
Platelets: 219 10*3/uL (ref 150–400)
RBC: 4.43 MIL/uL (ref 3.87–5.11)
RDW: 13.3 % (ref 11.5–15.5)
WBC: 5.5 10*3/uL (ref 4.0–10.5)

## 2017-04-13 LAB — LIPASE, BLOOD: Lipase: 37 U/L (ref 11–51)

## 2017-04-13 LAB — URINALYSIS, ROUTINE W REFLEX MICROSCOPIC
BACTERIA UA: NONE SEEN
BILIRUBIN URINE: NEGATIVE
GLUCOSE, UA: NEGATIVE mg/dL
KETONES UR: NEGATIVE mg/dL
Leukocytes, UA: NEGATIVE
Nitrite: NEGATIVE
PROTEIN: NEGATIVE mg/dL
Specific Gravity, Urine: 1.01 (ref 1.005–1.030)
pH: 5 (ref 5.0–8.0)

## 2017-04-13 LAB — COMPREHENSIVE METABOLIC PANEL
ALT: 25 U/L (ref 14–54)
AST: 22 U/L (ref 15–41)
Albumin: 3.9 g/dL (ref 3.5–5.0)
Alkaline Phosphatase: 94 U/L (ref 38–126)
Anion gap: 8 (ref 5–15)
BILIRUBIN TOTAL: 0.6 mg/dL (ref 0.3–1.2)
BUN: 9 mg/dL (ref 6–20)
CO2: 23 mmol/L (ref 22–32)
Calcium: 8.6 mg/dL — ABNORMAL LOW (ref 8.9–10.3)
Chloride: 107 mmol/L (ref 101–111)
Creatinine, Ser: 0.64 mg/dL (ref 0.44–1.00)
GFR calc Af Amer: >60 mL/min (ref >60)
GLUCOSE: 93 mg/dL (ref 65–99)
POTASSIUM: 3.8 mmol/L (ref 3.5–5.1)
Sodium: 138 mmol/L (ref 135–145)
Total Protein: 6.7 g/dL (ref 6.5–8.1)

## 2017-04-13 LAB — I-STAT BETA HCG BLOOD, ED (NOT ORDERABLE)

## 2017-04-13 LAB — I-STAT BETA HCG BLOOD, ED (MC, WL, AP ONLY): I-stat hCG, quantitative: <5 m[IU]/mL (ref <5)

## 2017-04-13 SURGERY — LAPAROSCOPIC CHOLECYSTECTOMY
Anesthesia: General | Site: Abdomen

## 2017-04-13 MED ORDER — OXYCODONE HCL 5 MG PO TABS
5.0000 mg | ORAL_TABLET | ORAL | Status: DC | PRN
  Administered 2017-04-13 – 2017-04-14 (×5): 10 mg via ORAL
  Filled 2017-04-13 (×3): qty 2

## 2017-04-13 MED ORDER — HYDROMORPHONE HCL 1 MG/ML IJ SOLN
INTRAMUSCULAR | Status: AC
  Filled 2017-04-13: qty 0.5

## 2017-04-13 MED ORDER — HYDROMORPHONE HCL 1 MG/ML IJ SOLN
1.0000 mg | INTRAMUSCULAR | Status: DC | PRN
  Administered 2017-04-13: 1 mg via INTRAVENOUS
  Filled 2017-04-13: qty 1

## 2017-04-13 MED ORDER — SODIUM CHLORIDE 0.9 % IV SOLN
INTRAVENOUS | Status: DC
  Administered 2017-04-13: 12:00:00 via INTRAVENOUS

## 2017-04-13 MED ORDER — HYDROMORPHONE HCL 1 MG/ML IJ SOLN
INTRAMUSCULAR | Status: AC
  Administered 2017-04-13: 0.5 mg via INTRAVENOUS
  Filled 2017-04-13: qty 0.5

## 2017-04-13 MED ORDER — PANTOPRAZOLE SODIUM 40 MG PO TBEC
40.0000 mg | DELAYED_RELEASE_TABLET | Freq: Every day | ORAL | Status: DC
  Administered 2017-04-14: 40 mg via ORAL
  Filled 2017-04-13: qty 1

## 2017-04-13 MED ORDER — DIPHENHYDRAMINE HCL 25 MG PO CAPS: 25.0000 mg | ORAL_CAPSULE | Freq: Four times a day (QID) | ORAL | Status: DC | PRN

## 2017-04-13 MED ORDER — MIDAZOLAM HCL 5 MG/5ML IJ SOLN
INTRAMUSCULAR | Status: DC | PRN
  Administered 2017-04-13: 2 mg via INTRAVENOUS

## 2017-04-13 MED ORDER — LIDOCAINE 2% (20 MG/ML) 5 ML SYRINGE
INTRAMUSCULAR | Status: AC
  Filled 2017-04-13: qty 5

## 2017-04-13 MED ORDER — ONDANSETRON HCL 4 MG/2ML IJ SOLN
INTRAMUSCULAR | Status: DC | PRN
  Administered 2017-04-13: 4 mg via INTRAVENOUS

## 2017-04-13 MED ORDER — ONDANSETRON 4 MG PO TBDP: 4.0000 mg | ORAL_TABLET | Freq: Four times a day (QID) | ORAL | Status: DC | PRN

## 2017-04-13 MED ORDER — ONDANSETRON HCL 4 MG/2ML IJ SOLN
INTRAMUSCULAR | Status: AC
  Filled 2017-04-13: qty 2

## 2017-04-13 MED ORDER — ENOXAPARIN SODIUM 40 MG/0.4ML ~~LOC~~ SOLN: 40.0000 mg | SUBCUTANEOUS | Status: DC

## 2017-04-13 MED ORDER — BUPIVACAINE HCL 0.25 % IJ SOLN
INTRAMUSCULAR | Status: DC | PRN
  Administered 2017-04-13: 9 mL

## 2017-04-13 MED ORDER — ROCURONIUM BROMIDE 10 MG/ML (PF) SYRINGE
PREFILLED_SYRINGE | INTRAVENOUS | Status: DC | PRN
  Administered 2017-04-13: 50 mg via INTRAVENOUS

## 2017-04-13 MED ORDER — PROPOFOL 10 MG/ML IV BOLUS
INTRAVENOUS | Status: AC
  Filled 2017-04-13: qty 20

## 2017-04-13 MED ORDER — ROCURONIUM BROMIDE 10 MG/ML (PF) SYRINGE
PREFILLED_SYRINGE | INTRAVENOUS | Status: AC
  Filled 2017-04-13: qty 5

## 2017-04-13 MED ORDER — IOPAMIDOL (ISOVUE-300) INJECTION 61%
INTRAVENOUS | Status: AC
  Filled 2017-04-13: qty 50

## 2017-04-13 MED ORDER — FENTANYL CITRATE (PF) 250 MCG/5ML IJ SOLN
INTRAMUSCULAR | Status: AC
  Filled 2017-04-13: qty 5

## 2017-04-13 MED ORDER — PROPOFOL 10 MG/ML IV BOLUS
INTRAVENOUS | Status: DC | PRN
  Administered 2017-04-13: 170 mg via INTRAVENOUS

## 2017-04-13 MED ORDER — LIDOCAINE 2% (20 MG/ML) 5 ML SYRINGE
INTRAMUSCULAR | Status: DC | PRN
  Administered 2017-04-13: 100 mg via INTRAVENOUS

## 2017-04-13 MED ORDER — ACETAMINOPHEN 650 MG RE SUPP: 650.0000 mg | Freq: Four times a day (QID) | RECTAL | Status: DC | PRN

## 2017-04-13 MED ORDER — CEFAZOLIN SODIUM-DEXTROSE 2-4 GM/100ML-% IV SOLN
2.0000 g | INTRAVENOUS | Status: AC
  Administered 2017-04-13: 2 g via INTRAVENOUS
  Filled 2017-04-13: qty 100

## 2017-04-13 MED ORDER — METOPROLOL TARTRATE 100 MG PO TABS
100.0000 mg | ORAL_TABLET | Freq: Every day | ORAL | Status: DC
  Administered 2017-04-14: 100 mg via ORAL
  Filled 2017-04-13: qty 1

## 2017-04-13 MED ORDER — MIDAZOLAM HCL 2 MG/2ML IJ SOLN
INTRAMUSCULAR | Status: AC
  Filled 2017-04-13: qty 2

## 2017-04-13 MED ORDER — SUGAMMADEX SODIUM 200 MG/2ML IV SOLN
INTRAVENOUS | Status: DC | PRN
  Administered 2017-04-13: 200 mg via INTRAVENOUS

## 2017-04-13 MED ORDER — POLYETHYLENE GLYCOL 3350 17 G PO PACK: 17.0000 g | PACK | Freq: Every day | ORAL | Status: DC | PRN

## 2017-04-13 MED ORDER — ONDANSETRON HCL 4 MG/2ML IJ SOLN
4.0000 mg | Freq: Four times a day (QID) | INTRAMUSCULAR | Status: DC | PRN
  Administered 2017-04-13: 4 mg via INTRAVENOUS
  Filled 2017-04-13: qty 2

## 2017-04-13 MED ORDER — SODIUM CHLORIDE 0.9 % IR SOLN
Status: DC | PRN
  Administered 2017-04-13: 1000 mL

## 2017-04-13 MED ORDER — OXYCODONE HCL 5 MG PO TABS
ORAL_TABLET | ORAL | Status: AC
  Filled 2017-04-13: qty 2

## 2017-04-13 MED ORDER — SUGAMMADEX SODIUM 200 MG/2ML IV SOLN
INTRAVENOUS | Status: AC
  Filled 2017-04-13: qty 2

## 2017-04-13 MED ORDER — DIPHENHYDRAMINE HCL 50 MG/ML IJ SOLN: 25.0000 mg | Freq: Four times a day (QID) | INTRAMUSCULAR | Status: DC | PRN

## 2017-04-13 MED ORDER — SODIUM CHLORIDE 0.9 % IV BOLUS (SEPSIS)
1000.0000 mL | Freq: Once | INTRAVENOUS | Status: AC
  Administered 2017-04-13: 1000 mL via INTRAVENOUS

## 2017-04-13 MED ORDER — 0.9 % SODIUM CHLORIDE (POUR BTL) OPTIME
TOPICAL | Status: DC | PRN
  Administered 2017-04-13: 1000 mL

## 2017-04-13 MED ORDER — BUPIVACAINE HCL (PF) 0.25 % IJ SOLN
INTRAMUSCULAR | Status: AC
  Filled 2017-04-13: qty 30

## 2017-04-13 MED ORDER — HYDRALAZINE HCL 20 MG/ML IJ SOLN
10.0000 mg | INTRAMUSCULAR | Status: DC | PRN
Start: 2017-04-13 — End: 2017-04-14

## 2017-04-13 MED ORDER — KCL IN DEXTROSE-NACL 20-5-0.45 MEQ/L-%-% IV SOLN
INTRAVENOUS | Status: DC
  Administered 2017-04-13 – 2017-04-14 (×2): via INTRAVENOUS
  Filled 2017-04-13 (×2): qty 1000

## 2017-04-13 MED ORDER — ACETAMINOPHEN 325 MG PO TABS: 650.0000 mg | ORAL_TABLET | Freq: Four times a day (QID) | ORAL | Status: DC | PRN

## 2017-04-13 MED ORDER — HYDROMORPHONE HCL 1 MG/ML IJ SOLN
0.2500 mg | INTRAMUSCULAR | Status: DC | PRN
  Administered 2017-04-13 (×4): 0.5 mg via INTRAVENOUS

## 2017-04-13 MED ORDER — CEFAZOLIN SODIUM-DEXTROSE 2-4 GM/100ML-% IV SOLN
INTRAVENOUS | Status: AC
  Filled 2017-04-13: qty 100

## 2017-04-13 MED ORDER — FENTANYL CITRATE (PF) 100 MCG/2ML IJ SOLN
INTRAMUSCULAR | Status: DC | PRN
  Administered 2017-04-13: 100 ug via INTRAVENOUS
  Administered 2017-04-13: 150 ug via INTRAVENOUS

## 2017-04-13 SURGICAL SUPPLY — 43 items
APPLIER CLIP 5 13 M/L LIGAMAX5 (MISCELLANEOUS)
BENZOIN TINCTURE PRP APPL 2/3 (GAUZE/BANDAGES/DRESSINGS) ×4 IMPLANT
CANISTER SUCT 3000ML PPV (MISCELLANEOUS) ×4 IMPLANT
CHLORAPREP W/TINT 26ML (MISCELLANEOUS) ×4 IMPLANT
CLIP APPLIE 5 13 M/L LIGAMAX5 (MISCELLANEOUS) IMPLANT
CLIP LIGATING HEMO O LOK GREEN (MISCELLANEOUS) ×4 IMPLANT
CLOSURE WOUND 1/2 X4 (GAUZE/BANDAGES/DRESSINGS) ×1
COVER MAYO STAND STRL (DRAPES) ×4 IMPLANT
COVER SURGICAL LIGHT HANDLE (MISCELLANEOUS) ×4 IMPLANT
COVER TRANSDUCER ULTRASND (DRAPES) IMPLANT
DEVICE TROCAR PUNCTURE CLOSURE (ENDOMECHANICALS) ×4 IMPLANT
DRAPE C-ARM 42X72 X-RAY (DRAPES) IMPLANT
ELECT REM PT RETURN 9FT ADLT (ELECTROSURGICAL) ×4
ELECTRODE REM PT RTRN 9FT ADLT (ELECTROSURGICAL) ×2 IMPLANT
GAUZE SPONGE 2X2 8PLY STRL LF (GAUZE/BANDAGES/DRESSINGS) ×2 IMPLANT
GLOVE BIO SURGEON STRL SZ7.5 (GLOVE) ×4 IMPLANT
GOWN STRL REUS W/ TWL LRG LVL3 (GOWN DISPOSABLE) ×4 IMPLANT
GOWN STRL REUS W/ TWL XL LVL3 (GOWN DISPOSABLE) ×2 IMPLANT
GOWN STRL REUS W/TWL LRG LVL3 (GOWN DISPOSABLE) ×4
GOWN STRL REUS W/TWL XL LVL3 (GOWN DISPOSABLE) ×2
IV CATH 14GX2 1/4 (CATHETERS) IMPLANT
KIT BASIN OR (CUSTOM PROCEDURE TRAY) ×4 IMPLANT
KIT ROOM TURNOVER OR (KITS) ×4 IMPLANT
NEEDLE INSUFFLATION 14GA 120MM (NEEDLE) ×4 IMPLANT
NS IRRIG 1000ML POUR BTL (IV SOLUTION) ×4 IMPLANT
PAD ARMBOARD 7.5X6 YLW CONV (MISCELLANEOUS) ×8 IMPLANT
POUCH RETRIEVAL ECOSAC 10 (ENDOMECHANICALS) ×2 IMPLANT
POUCH RETRIEVAL ECOSAC 10MM (ENDOMECHANICALS) ×2
POUCH SPECIMEN RETRIEVAL 10MM (ENDOMECHANICALS) IMPLANT
SCISSORS LAP 5X35 DISP (ENDOMECHANICALS) ×4 IMPLANT
SET CHOLANGIOGRAPHY FRANKLIN (SET/KITS/TRAYS/PACK) ×4 IMPLANT
SET IRRIG TUBING LAPAROSCOPIC (IRRIGATION / IRRIGATOR) ×4 IMPLANT
SLEEVE ENDOPATH XCEL 5M (ENDOMECHANICALS) ×4 IMPLANT
SPECIMEN JAR SMALL (MISCELLANEOUS) ×4 IMPLANT
SPONGE GAUZE 2X2 STER 10/PKG (GAUZE/BANDAGES/DRESSINGS) ×2
STRIP CLOSURE SKIN 1/2X4 (GAUZE/BANDAGES/DRESSINGS) ×3 IMPLANT
SUT MNCRL AB 3-0 PS2 18 (SUTURE) ×4 IMPLANT
TOWEL OR 17X24 6PK STRL BLUE (TOWEL DISPOSABLE) ×4 IMPLANT
TOWEL OR 17X26 10 PK STRL BLUE (TOWEL DISPOSABLE) ×4 IMPLANT
TRAY LAPAROSCOPIC MC (CUSTOM PROCEDURE TRAY) ×4 IMPLANT
TROCAR XCEL NON-BLD 11X100MML (ENDOMECHANICALS) ×4 IMPLANT
TROCAR XCEL NON-BLD 5MMX100MML (ENDOMECHANICALS) ×4 IMPLANT
TUBING INSUFFLATION (TUBING) ×4 IMPLANT

## 2017-04-13 NOTE — Anesthesia Procedure Notes (Signed)
Procedure Name: Intubation Date/Time: 04/13/2017 1:36 PM Performed by: Melina Copa, Ladavia Lindenbaum R Pre-anesthesia Checklist: Patient identified, Emergency Drugs available, Suction available and Patient being monitored Patient Re-evaluated:Patient Re-evaluated prior to inductionOxygen Delivery Method: Circle System Utilized Preoxygenation: Pre-oxygenation with 100% oxygen Intubation Type: IV induction Ventilation: Mask ventilation without difficulty Laryngoscope Size: Mac and 3 Grade View: Grade II Tube type: Oral Number of attempts: 1 Airway Equipment and Method: Stylet Placement Confirmation: ETT inserted through vocal cords under direct vision,  positive ETCO2 and breath sounds checked- equal and bilateral Secured at: 20 cm Tube secured with: Tape Dental Injury: Teeth and Oropharynx as per pre-operative assessment

## 2017-04-13 NOTE — ED Notes (Signed)
Pt ambulated to room, was able to provide urine sample. Pt placed in gown and on monitor.

## 2017-04-13 NOTE — Transfer of Care (Signed)
Immediate Anesthesia Transfer of Care Note  Patient: Kendra Coleman  Procedure(s) Performed: Procedure(s): LAPAROSCOPIC CHOLECYSTECTOMY (N/A)  Patient Location: PACU  Anesthesia Type:General  Level of Consciousness: awake, oriented and patient cooperative  Airway & Oxygen Therapy: Patient Spontanous Breathing and Patient connected to nasal cannula oxygen  Post-op Assessment: Report given to RN, Post -op Vital signs reviewed and stable and Patient moving all extremities  Post vital signs: Reviewed and stable  Last Vitals:  Vitals:   04/13/17 1200 04/13/17 1230  BP: (!) 146/105 132/85  Pulse: 83 81  Resp:    Temp:      Last Pain:  Vitals:   04/13/17 1205  TempSrc:   PainSc: 5          Complications: No apparent anesthesia complications

## 2017-04-13 NOTE — ED Triage Notes (Signed)
Pt reports she was sent here to have her gallbladder removed. She had an [place] this morning which resulted "Cholelithiasis with a gallstone impacted in the gallbladder neck measuring 2 cm. Gallbladder wall thickening measuring 7.3 mm with fluid in the gallbladder wall. Findings most concerning for acute cholecystitis." She denies having any pain other than a headache.

## 2017-04-13 NOTE — H&P (Signed)
[facility] Admission Note  Kendra Coleman [facility] 07/17/1966  [id number].    Requesting MD: Jeanell Sparrow Chief Complaint/Reason for Consult: Abdominal Pain HPI:  Patient is a 51 y.o. Female who was sent over to [facility] by [facility] after abdominal US showed gallstone impacted in neck of gallbladder and gallbladder wall thickening. Patient has had 2 episodes of RUQ pain and nausea since last week, each lasting a couple of hours. Pain has been dull and radiates around to back and up into chest. Pain was improved with hydrocodone, and she does not notice anything that has made it worse. Denies recent fevers. States she's had one episode diarrhea, no blood in stool. Patient history significant for HTN and GERD. Takes metoprolol and prilosec daily. Was recently on abx for UTI 2 weeks ago, no longer taking. Last had a small amt of liquid this AM around 7 with morning meds.   Abdominal surgeries: appendectomy Blood thinners: none.  ROS: Review of Systems  Constitutional: Negative for chills, fever and malaise/fatigue.  HENT: Positive for congestion.   Respiratory: Negative for cough, shortness of breath and wheezing.   Cardiovascular: Negative for chest pain, palpitations and leg swelling.  Gastrointestinal: Positive for abdominal pain, diarrhea, heartburn and nausea. Negative for blood in stool, constipation, melena and vomiting.  Genitourinary: Negative for dysuria.  Neurological: Positive for headaches. Negative for weakness.  All other systems reviewed and are negative.   No family history on file.  Past Medical History:  Diagnosis Date  . Hypertension   . Kidney calculi     Past Surgical History:  Procedure Laterality Date  . APPENDECTOMY      Social History:  reports that she has never smoked. She has never used smokeless tobacco. She reports that she drinks alcohol. She reports that she does not use drugs.  Allergies: No Known Allergies   (Not in a hospital  admission)  Blood pressure (!) 155/105, pulse 87, temperature 98.5 F (36.9 C), temperature source Oral, resp. rate 18, SpO2 99 %. Physical Exam: Physical Exam  Constitutional: She is oriented to person, place, and time. She appears well-developed and well-nourished. She is cooperative.  Non-toxic appearance. No distress.  HENT:  Head: Normocephalic and atraumatic.  Right Ear: External ear normal.  Left Ear: External ear normal.  Nose: Nose normal.  Mouth/Throat: Oropharynx is clear and moist and mucous membranes are normal.  Eyes: Conjunctivae and lids are normal. No scleral icterus.  Neck: Trachea normal and normal range of motion.  Cardiovascular: Regular rhythm, S1 normal, S2 normal and intact distal pulses.   Pulses:      Radial pulses are 2+ on the right side, and 2+ on the left side.       Dorsalis pedis pulses are 2+ on the right side, and 2+ on the left side.  Pulmonary/Chest: Effort normal and breath sounds normal. No stridor. She has no wheezes. She has no rhonchi. She has no rales.  Abdominal: Soft. Normal appearance and bowel sounds are normal. She exhibits no shifting dullness and no distension. There is no hepatosplenomegaly. There is no tenderness. There is negative Murphy's sign.  Musculoskeletal: Normal range of motion.  No gross deformities  Neurological: She is alert and oriented to person, place, and time. She has normal strength. No sensory deficit.  Skin: Skin is warm, dry and intact. No rash noted.  Psychiatric: She has a normal mood and affect. Her speech is normal and behavior is normal. Judgment and thought content normal. Cognition and memory  are normal.    Results for orders placed or performed during the hospital encounter of 04/13/17 (from the past 48 hour(s))  Lipase, blood     Status: None   Collection Time: 04/13/17 11:23 AM  Result Value Ref Range   Lipase 37 11 - 51 U/L  Comprehensive metabolic panel     Status: Abnormal   Collection Time:  04/13/17 11:23 AM  Result Value Ref Range   Sodium 138 135 - 145 mmol/L   Potassium 3.8 3.5 - 5.1 mmol/L   Chloride 107 101 - 111 mmol/L   CO2 23 22 - 32 mmol/L   Glucose, Bld 93 65 - 99 mg/dL   BUN 9 6 - 20 mg/dL   Creatinine, Ser 0.64 0.44 - 1.00 mg/dL   Calcium 8.6 (L) 8.9 - 10.3 mg/dL   Total Protein 6.7 6.5 - 8.1 g/dL   Albumin 3.9 3.5 - 5.0 g/dL   AST 22 15 - 41 U/L   ALT 25 14 - 54 U/L   Alkaline Phosphatase 94 38 - 126 U/L   Total Bilirubin 0.6 0.3 - 1.2 mg/dL   GFR calc non Af Amer >60 >60 mL/min   GFR calc Af Amer >60 >60 mL/min    Comment: (NOTE) The eGFR has been calculated using the CKD EPI equation. This calculation has not been validated in all clinical situations. eGFR's persistently <60 mL/min signify possible Chronic Kidney Disease.    Anion gap 8 5 - 15  CBC     Status: None   Collection Time: 04/13/17 11:23 AM  Result Value Ref Range   WBC 5.5 4.0 - 10.5 K/uL   RBC 4.43 3.87 - 5.11 MIL/uL   Hemoglobin 13.1 12.0 - 15.0 g/dL   HCT 39.1 36.0 - 46.0 %   MCV 88.3 78.0 - 100.0 fL   MCH 29.6 26.0 - 34.0 pg   MCHC 33.5 30.0 - 36.0 g/dL   RDW 13.3 11.5 - 15.5 %   Platelets 219 150 - 400 K/uL  Urinalysis, Routine w reflex microscopic     Status: Abnormal   Collection Time: 04/13/17 11:45 AM  Result Value Ref Range   Color, Urine YELLOW YELLOW   APPearance CLEAR CLEAR   Specific Gravity, Urine 1.010 1.005 - 1.030   pH 5.0 5.0 - 8.0   Glucose, UA NEGATIVE NEGATIVE mg/dL   Hgb urine dipstick SMALL (A) NEGATIVE   Bilirubin Urine NEGATIVE NEGATIVE   Ketones, ur NEGATIVE NEGATIVE mg/dL   Protein, ur NEGATIVE NEGATIVE mg/dL   Nitrite NEGATIVE NEGATIVE   Leukocytes, UA NEGATIVE NEGATIVE   RBC / HPF 0-5 0 - 5 RBC/hpf   WBC, UA 0-5 0 - 5 WBC/hpf   Bacteria, UA NONE SEEN NONE SEEN   Squamous Epithelial / LPF 0-5 (A) NONE SEEN   Mucous PRESENT    Hyaline Casts, UA PRESENT   I-Stat beta hCG blood, ED     Status: None   Collection Time: 04/13/17 12:17 PM  Result  Value Ref Range   I-stat hCG, quantitative <5.0 <5 mIU/mL   Comment 3            Comment:   GEST. AGE      CONC.  (mIU/mL)   <=1 WEEK        5 - 50     2 WEEKS       50 - 500     3 WEEKS       100 - 10,000  4 WEEKS     1,000 - 30,000        FEMALE AND NON-PREGNANT FEMALE:     LESS THAN 5 mIU/mL    US Abdomen Limited Ruq  Result Date: 04/13/2017 CLINICAL DATA:  Right upper quadrant pain. EXAM: US ABDOMEN LIMITED - RIGHT UPPER QUADRANT COMPARISON:  None. FINDINGS: Gallbladder: Cholelithiasis. Gallstone impacted in the gallbladder neck measuring 1.9 cm. 7 mm non mobile echogenic focus along the gallbladder wall likely reflecting a small polyp. Gallbladder wall thickening measuring 7.3 mm. No pericholecystic fluid. Small amount of gallbladder sludge. Common bile duct: Diameter: 1.9 mm Liver: No focal lesion identified. Increased hepatic parenchymal echogenicity. Normal hepatopetal flow in the portal vein. IMPRESSION: 1. Cholelithiasis with a gallstone impacted in the gallbladder neck measuring 2 cm. Gallbladder wall thickening measuring 7.3 mm with fluid in the gallbladder wall. Findings most concerning for acute cholecystitis. Electronically Signed   By: Kathreen Devoid   On: 04/13/2017 09:23      Assessment/Plan 1. Plan for OR for laparoscopic cholecystectomy 2.  NPO, IVF, Ancef on call to OR, obtain consent 3. Admit to Med-surg post-operatively   Brigid Re, [facility] 04/13/2017, 12:41 PM Pager: [phone] Consults: [phone] Mon-Fri 7:00 am-4:30 pm Sat-Sun 7:00 am-11:30 am

## 2017-04-13 NOTE — Op Note (Signed)
04/13/2017  2:18 PM  PATIENT:  Kendra Coleman  51 y.o. female  PRE-OPERATIVE DIAGNOSIS:  ACUTE CHOLECYSTITIS  POST-OPERATIVE DIAGNOSIS:  Acute CHOLECYSTITIS, cholelithiasis  PROCEDURE:  Procedure(s): LAPAROSCOPIC CHOLECYSTECTOMY (N/A)  SURGEON:  Surgeon(s) and Role:    * Ralene Ok, MD - Primary    * Georganna Skeans, MD - Assisting  ANESTHESIA:   local and general  EBL: 10cc  BLOOD ADMINISTERED:none  DRAINS: none   LOCAL MEDICATIONS USED:  BUPIVICAINE   SPECIMEN:  Source of Specimen:  gallbladder  DISPOSITION OF SPECIMEN:  PATHOLOGY  COUNTS:  YES  TOURNIQUET:  * No tourniquets in log *  DICTATION: .Dragon Dictation The patient was taken to the operating and placed in the supine position with bilateral SCDs in place. The patient was prepped and draped in the usual sterile fashion. A time out was called and all facts were verified. A pneumoperitoneum was obtained via A Veress needle technique to a pressure of 14mm of mercury.  A 47mm trochar was then placed in the right upper quadrant under visualization, and there were no injuries to any abdominal organs. A 11 mm port was then placed in the umbilical region after infiltrating with local anesthesia under direct visualization. A second and third epigastric port and right lower quadrant port placement under direct visualization, respectively. The liver was very fatty and large.   The gallbladder was identified and retracted, the peritoneum was then sharply dissected from the gallbladder and this dissection was carried down to Calot's triangle. The gallbladder was identified and stripped away circumferentially and seen going into the gallbladder 360, the critical angle was obtained.  2 clips were placed proximally one distally and the cystic duct transected. The cystic artery was identified and 2 clips placed proximally and one distally and transected. We then proceeded to remove the gallbladder off the hepatic  fossa with Bovie cautery. A retrieval bag was then placed in the abdomen and gallbladder placed in the bag. The hepatic fossa was then reexamined and hemostasis was achieved with Bovie cautery and was excellent at the end of the case. The subhepatic fossa and perihepatic fossa was then irrigated until the effluent was clear. The 11 mm trocar fascia was reapproximated with the Endo Close #1 Vicryl x2. The pneumoperitoneum was evacuated and all trochars removed under direct visulalization. The skin was then closed with 4-0 Monocryl and the skin dressed with Steri-Strips, gauze, and tape. The patient was awaken from general anesthesia and taken to the recovery room in stable condition.   PLAN OF CARE: Admit for overnight observation  PATIENT DISPOSITION:  PACU - hemodynamically stable.   Delay start of Pharmacological VTE agent (>24hrs) due to surgical blood loss or risk of bleeding: not applicable

## 2017-04-13 NOTE — Anesthesia Preprocedure Evaluation (Addendum)
Anesthesia Evaluation  Patient identified by MRN, date of birth, ID band Patient awake    Reviewed: Allergy & Precautions, H&P , NPO status , Patient's Chart, lab work & pertinent test results, reviewed documented beta blocker date and time   Airway Mallampati: II  TM Distance: >3 FB Neck ROM: Full    Dental no notable dental hx. (+) Teeth Intact, Dental Advisory Given   Pulmonary neg pulmonary ROS,    Pulmonary exam normal breath sounds clear to auscultation       Cardiovascular hypertension, Pt. on medications and Pt. on home beta blockers  Rhythm:Regular Rate:Normal     Neuro/Psych negative neurological ROS  negative psych ROS   GI/Hepatic negative GI ROS, Neg liver ROS,   Endo/Other  negative endocrine ROS  Renal/GU Renal disease  negative genitourinary   Musculoskeletal   Abdominal   Peds  Hematology negative hematology ROS (+)   Anesthesia Other Findings   Reproductive/Obstetrics negative OB ROS                            Anesthesia Physical Anesthesia Plan  ASA: II  Anesthesia Plan: General   Post-op Pain Management:    Induction: Intravenous  Airway Management Planned: Oral ETT  Additional Equipment:   Intra-op Plan:   Post-operative Plan: Extubation in OR  Informed Consent: I have reviewed the patients History and Physical, chart, labs and discussed the procedure including the risks, benefits and alternatives for the proposed anesthesia with the patient or authorized representative who has indicated his/her understanding and acceptance.   Dental advisory given  Plan Discussed with: CRNA  Anesthesia Plan Comments:         Anesthesia Quick Evaluation

## 2017-04-13 NOTE — Anesthesia Postprocedure Evaluation (Signed)
Anesthesia Post Note  Patient: Kendra Coleman  Procedure(s) Performed: Procedure(s) (LRB): LAPAROSCOPIC CHOLECYSTECTOMY (N/A)  Patient location during evaluation: PACU Anesthesia Type: General Level of consciousness: awake and alert Pain management: pain level controlled Vital Signs Assessment: post-procedure vital signs reviewed and stable Respiratory status: spontaneous breathing, nonlabored ventilation and respiratory function stable Cardiovascular status: blood pressure returned to baseline and stable Postop Assessment: no signs of nausea or vomiting Anesthetic complications: no       Last Vitals:  Vitals:   04/13/17 1505 04/13/17 1510  BP:  (!) 138/91  Pulse: 71 73  Resp: 12 11  Temp:  36.6 C    Last Pain:  Vitals:   04/13/17 1505  TempSrc:   PainSc: 8                  Yunique Dearcos,W. EDMOND

## 2017-04-13 NOTE — ED Provider Notes (Signed)
[place] DEPT Provider Note   CSN: [id number] Arrival date & time: 04/13/17  1059     History   Chief Complaint Chief Complaint  Patient presents with  . Abdominal Pain    HPI Kendra Coleman is a 51 y.o. female.  HPI 51 year old female sent to the ED after ultrasound which showed impacted stone in gallbladder neck with associated gallbladder wall thickening consistent with acute cholecystitis. Patient reports 2 episodes of right upper quadrant pain. The most recent woke her from sleep yesterday morning it lasted most of the day she had associated nausea and did not eat well she was having the pain but also did not vomit. She states that she was able to eat yesterday evening after the pain resolved. She took a hydrocodone which helped with the resolution. She had gallbladder ultrasound scheduled this morning and has not had anything to eat today. She has regular periods last menstrual period 3 weeks ago and has a bilateral tubal ligation. She did have some right sided chest pain and shoulder pain the previous episode of abdominal pain and nausea and vomiting. She currently denies any chest pain or dyspnea. Past Medical History:  Diagnosis Date  . Hypertension   . Kidney calculi     There are no active problems to display for this patient.   Past Surgical History:  Procedure Laterality Date  . APPENDECTOMY      OB History    No data available       Home Medications    Prior to Admission medications   Medication Sig Start Date End Date Taking? Authorizing Provider  metoprolol (LOPRESSOR) 100 MG tablet Take 100 mg by mouth daily.    Yes [provider]  omeprazole (PRILOSEC) 20 MG capsule Take 1 capsule (20 mg total) by mouth daily. 12/25/16  Yes Charlann Lange, PA-C  HYDROcodone-acetaminophen (NORCO/VICODIN) 5-325 MG tablet Take 1-2 tablets by mouth every 4 (four) hours as needed. 12/09/16   Ocie Cornfield T, PA-C  ondansetron (ZOFRAN) 4 MG tablet  Take 1 tablet (4 mg total) by mouth every 6 (six) hours. Patient not taking: Reported on 12/24/2016 12/09/16   Ocie Cornfield T, PA-C  oxyCODONE-acetaminophen (PERCOCET/ROXICET) 5-325 MG per tablet Take 1-2 tablets by mouth every 4 (four) hours as needed for severe pain. Patient not taking: Reported on 12/24/2016 07/24/14   Charlann Lange, PA-C  tamsulosin (FLOMAX) 0.4 MG CAPS capsule Take 1 capsule (0.4 mg total) by mouth daily. Patient not taking: Reported on 12/24/2016 12/09/16   Doristine Devoid, PA-C    Family History No family history on file.  Social History Social History  Substance Use Topics  . Smoking status: Never Smoker  . Smokeless tobacco: Never Used  . Alcohol use Yes     Comment: occ     Allergies   Patient has no known allergies.   Review of Systems Review of Systems  All other systems reviewed and are negative.    Physical Exam Updated Vital Signs BP (!) 155/105 (BP Location: Left Arm)   Pulse 87   Temp 98.5 F (36.9 C) (Oral)   Resp 18   SpO2 99%   Physical Exam  Constitutional: She is oriented to person, place, and time. She appears well-developed and well-nourished. No distress.  HENT:  Head: Normocephalic and atraumatic.  Right Ear: External ear normal.  Left Ear: External ear normal.  Nose: Nose normal.  Eyes: Conjunctivae and EOM are normal. Pupils are equal, round, and reactive to light.  Neck: Normal range of motion. Neck supple.  Cardiovascular: Normal rate, regular rhythm and normal heart sounds.   Pulmonary/Chest: Effort normal.  Abdominal: Soft. Bowel sounds are normal. She exhibits no distension and no mass. There is no tenderness. There is no guarding.  Musculoskeletal: Normal range of motion.  Neurological: She is alert and oriented to person, place, and time. She exhibits normal muscle tone. Coordination normal.  Skin: Skin is warm and dry.  Psychiatric: She has a normal mood and affect. Her behavior is normal. Thought content  normal.  Nursing note and vitals reviewed.    ED Treatments / Results  Labs (all labs ordered are listed, but only abnormal results are displayed) Labs Reviewed  LIPASE, BLOOD  COMPREHENSIVE METABOLIC PANEL  CBC  URINALYSIS, ROUTINE W REFLEX MICROSCOPIC  I-STAT BETA HCG BLOOD, ED ([facility], [facility], [facility] ONLY)    EKG  EKG Interpretation None       Radiology US Abdomen Limited Ruq  Result Date: 04/13/2017 CLINICAL DATA:  Right upper quadrant pain. EXAM: US ABDOMEN LIMITED - RIGHT UPPER QUADRANT COMPARISON:  None. FINDINGS: Gallbladder: Cholelithiasis. Gallstone impacted in the gallbladder neck measuring 1.9 cm. 7 mm non mobile echogenic focus along the gallbladder wall likely reflecting a small polyp. Gallbladder wall thickening measuring 7.3 mm. No pericholecystic fluid. Small amount of gallbladder sludge. Common bile duct: Diameter: 1.9 mm Liver: No focal lesion identified. Increased hepatic parenchymal echogenicity. Normal hepatopetal flow in the portal vein. IMPRESSION: 1. Cholelithiasis with a gallstone impacted in the gallbladder neck measuring 2 cm. Gallbladder wall thickening measuring 7.3 mm with fluid in the gallbladder wall. Findings most concerning for acute cholecystitis. Electronically Signed   By: Kathreen Devoid   On: 04/13/2017 09:23    Procedures Procedures (including critical care time)  Medications Ordered in ED Medications  sodium chloride 0.9 % bolus 1,000 mL (not administered)    And  0.9 %  sodium chloride infusion (not administered)     Initial Impression / Assessment and Plan / ED Course  I have reviewed the triage vital signs and the nursing notes.  Pertinent labs & imaging results that were available during my care of the patient were reviewed by me and considered in my medical decision making (see chart for details).    11:52 AM  Discussed with general surgery PA and they will see.  Final Clinical Impressions(s) / ED Diagnoses   Final diagnoses:  Acute  cholecystitis    New Prescriptions New Prescriptions   No medications on file     Pattricia Boss, MD 04/13/17 1243

## 2017-04-14 ENCOUNTER — Encounter (HOSPITAL_COMMUNITY): Payer: Self-pay | Admitting: General Surgery

## 2017-04-14 LAB — COMPREHENSIVE METABOLIC PANEL
ALT: 27 U/L (ref 14–54)
ANION GAP: 5 (ref 5–15)
AST: 30 U/L (ref 15–41)
Albumin: 3.4 g/dL — ABNORMAL LOW (ref 3.5–5.0)
Alkaline Phosphatase: 84 U/L (ref 38–126)
BUN: 6 mg/dL (ref 6–20)
CALCIUM: 8.3 mg/dL — AB (ref 8.9–10.3)
CHLORIDE: 104 mmol/L (ref 101–111)
CO2: 27 mmol/L (ref 22–32)
Creatinine, Ser: 0.64 mg/dL (ref 0.44–1.00)
GFR calc non Af Amer: >60 mL/min (ref >60)
Glucose, Bld: 116 mg/dL — ABNORMAL HIGH (ref 65–99)
Potassium: 3.6 mmol/L (ref 3.5–5.1)
SODIUM: 136 mmol/L (ref 135–145)
Total Bilirubin: 0.3 mg/dL (ref 0.3–1.2)
Total Protein: 6 g/dL — ABNORMAL LOW (ref 6.5–8.1)

## 2017-04-14 LAB — CBC
HCT: 36.9 % (ref 36.0–46.0)
Hemoglobin: 12 g/dL (ref 12.0–15.0)
MCH: 29.1 pg (ref 26.0–34.0)
MCHC: 32.5 g/dL (ref 30.0–36.0)
MCV: 89.6 fL (ref 78.0–100.0)
PLATELETS: 220 10*3/uL (ref 150–400)
RBC: 4.12 MIL/uL (ref 3.87–5.11)
RDW: 13.5 % (ref 11.5–15.5)
WBC: 7.2 10*3/uL (ref 4.0–10.5)

## 2017-04-14 MED ORDER — HYDROCODONE-ACETAMINOPHEN 5-325 MG PO TABS: 1.0000 | ORAL_TABLET | ORAL | 0 refills | Status: DC | PRN

## 2017-04-14 NOTE — Discharge Instructions (Signed)
°  [facility] ° °LAPAROSCOPIC SURGERY:  POST-OP INSTRUCTIONS ° °Always review your discharge instruction sheet given to you by the facility where your surgery was performed. ° °A prescription for pain medication may be given to you upon discharge.  Take your pain medication as prescribed.  If narcotic pain medicine is not needed, then you may take acetaminophen (Tylenol) or ibuprofen (Advil) as needed. ° °Take your usually prescribed medications unless otherwise directed. ° °If you need a refill on your pain medication, please contact your pharmacy.  They will contact our office to request authorization. Prescriptions will not be filled after 5 P.M. or on weekends. ° °You should follow a light diet the first few days after arrival home, such as soup and crackers or toast.  Be sure to include plenty of fluids daily. ° °Most patients will experience some swelling and bruising in the area of the incisions.  Ice packs will help.  Swelling and bruising can take several days to resolve.  ° °It is common to experience some constipation after surgery.  Increasing fluid intake and taking a stool softener (such as Colace) will usually help or prevent this problem from occurring.  A mild laxative (Milk of Magnesia or Miralax) should be taken according to package instructions if there has been no bowel movement after 48 hours. ° °You will have steri-strips and a gauze dressing over your incisions.  You may remove the gauze bandage on the second day after surgery, and you may shower at that time.  Leave your steri-strips (small skin tapes) in place directly over the incision.  These strips should remain on the skin for 5-7 days and then be removed.  You may get them wet in the shower and pat them dry. ° °Any sutures or staples will be removed at the office during your follow-up visit. ° °ACTIVITIES:  You may resume regular (light) daily activities beginning the next day - such as daily self-care, walking,  climbing stairs - gradually increasing activities as tolerated.  You may have sexual intercourse when it is comfortable.  Refrain from any heavy lifting or straining until approved by your doctor. ° °You may drive when you are no longer taking prescription pain medication, you can comfortably wear a seatbelt, and you can safely maneuver your car and apply brakes. ° °You should see your doctor in the office for a follow-up appointment approximately 2-3 weeks after your surgery.  Make sure that you call for this appointment within a day or two after you arrive home to insure a convenient appointment time. ° °WHEN TO CALL YOUR DOCTOR: °1. Fever over 101.0 °2. Inability to urinate °3. Continued bleeding from incision °4. Increased pain, redness, or drainage from the incision °5. Increasing abdominal pain ° °The clinic staff is available to answer your questions during regular business hours.  Please don’t hesitate to call and ask to speak to one of the nurses for clinical concerns.  If you have a medical emergency, go to the nearest emergency room or call 911.  A surgeon from [facility] is always on call for the hospital. ° °Rannie Craney M. Lachae Hohler, MD, FACS °[facility] °Office: [phone] °Toll Free:  [phone] °FAX [phone] ° °Website: [email or web address] °

## 2017-04-14 NOTE — Progress Notes (Signed)
Lovena Le, student nurse went over discharge instructions with patient. Home medications were discussed and next dosage due written down. Prescription given. Follow up appointment to be made. Diet, activity, and signs and symptoms of infection gone over. Reasons to call the doctor discussed. Patient verbalized understanding of instructions.

## 2017-04-14 NOTE — Discharge Summary (Signed)
Physician Discharge Summary [facility]  Patient ID: Kendra Coleman MRN: [id number] DOB/AGE: 49-Dec-1967 49 y.o.  Admit date: 04/13/2017 Discharge date: 04/14/2017  Admission Diagnoses:  Chronic cholecystitis, cholelithiasis  Discharge Diagnoses:  Active Problems:   S/P laparoscopic cholecystectomy   Discharged Condition: good  Hospital Course: Patient was admitted for observation following gallbladder surgery.  Post op course was uncomplicated.  Pain was well controlled.  Tolerated diet.  Patient was prepared for discharge home on POD#1.  Consults: None  Treatments: surgery: lap chole with IOC  Discharge Exam: Blood pressure 133/88, pulse 91, temperature 98.3 F (36.8 C), temperature source Oral, resp. rate 12, height 5\' 7"  (1.702 m), weight 89.8 kg (198 lb), SpO2 95 %. HEENT - clear Neck - soft Chest - clear bilaterally Cor - RRR Abd - soft, obese; mild tenderness RUQ; wounds dry and intact  Disposition: Home  Discharge Instructions    Diet - low sodium heart healthy    Complete by:  As directed    Discharge instructions    Complete by:  As directed    [facility]  LAPAROSCOPIC SURGERY:  POST-OP INSTRUCTIONS  Always review your discharge instruction sheet given to you by the facility where your surgery was performed.  A prescription for pain medication may be given to you upon discharge.  Take your pain medication as prescribed.  If narcotic pain medicine is not needed, then you may take acetaminophen (Tylenol) or ibuprofen (Advil) as needed.  Take your usually prescribed medications unless otherwise directed.  If you need a refill on your pain medication, please contact your pharmacy.  They will contact our office to request authorization. Prescriptions will not be filled after 5 P.M. or on weekends.  You should follow a light diet the first few days after arrival home, such as soup and crackers or toast.  Be sure to  include plenty of fluids daily.  Most patients will experience some swelling and bruising in the area of the incisions.  Ice packs will help.  Swelling and bruising can take several days to resolve.   It is common to experience some constipation after surgery.  Increasing fluid intake and taking a stool softener (such as Colace) will usually help or prevent this problem from occurring.  A mild laxative (Milk of Magnesia or Miralax) should be taken according to package instructions if there has been no bowel movement after 48 hours.  You will have steri-strips and a gauze dressing over your incisions.  You may remove the gauze bandage on the second day after surgery, and you may shower at that time.  Leave your steri-strips (small skin tapes) in place directly over the incision.  These strips should remain on the skin for 5-7 days and then be removed.  You may get them wet in the shower and pat them dry.  Any sutures or staples will be removed at the office during your follow-up visit.  ACTIVITIES:  You may resume regular (light) daily activities beginning the next day - such as daily self-care, walking, climbing stairs - gradually increasing activities as tolerated.  You may have sexual intercourse when it is comfortable.  Refrain from any heavy lifting or straining until approved by your doctor.  You may drive when you are no longer taking prescription pain medication, you can comfortably wear a seatbelt, and you can safely maneuver your car and apply brakes.  You should see your doctor in the office for a follow-up appointment approximately  2-3 weeks after your surgery.  Make sure that you call for this appointment within a day or two after you arrive home to insure a convenient appointment time.  WHEN TO CALL YOUR DOCTOR: Fever over 101.0 Inability to urinate Continued bleeding from incision Increased pain, redness, or drainage from the incision Increasing abdominal pain  The clinic staff  is available to answer your questions during regular business hours.  Please don't hesitate to call and ask to speak to one of the nurses for clinical concerns.  If you have a medical emergency, go to the nearest emergency room or call 911.  A surgeon from [facility] is always on call for the hospital.  Earnstine Regal, MD, [facility] Office: [place] Free:  [place] [address] [phone]  Website: [email or web address]   Increase activity slowly    Complete by:  As directed    Remove dressing in 24 hours    Complete by:  As directed      Allergies as of 04/14/2017   No Known Allergies     Medication List    TAKE these medications   HYDROcodone-acetaminophen 5-325 MG tablet Commonly known as:  NORCO/VICODIN Take 1-2 tablets by mouth every 4 (four) hours as needed. What changed:  Another medication with the same name was added. Make sure you understand how and when to take each.   HYDROcodone-acetaminophen 5-325 MG tablet Commonly known as:  NORCO/VICODIN Take 1-2 tablets by mouth every 4 (four) hours as needed for moderate pain. What changed:  You were already taking a medication with the same name, and this prescription was added. Make sure you understand how and when to take each.   metoprolol succinate 100 MG 24 hr tablet Commonly known as:  TOPROL-XL Take 100 mg by mouth daily. Take with or immediately following a meal.   metoprolol tartrate 100 MG tablet Commonly known as:  LOPRESSOR Take 100 mg by mouth daily.   omeprazole 20 MG capsule Commonly known as:  PRILOSEC Take 1 capsule (20 mg total) by mouth daily.   ondansetron 4 MG tablet Commonly known as:  ZOFRAN Take 1 tablet (4 mg total) by mouth every 6 (six) hours.   oxyCODONE-acetaminophen 5-325 MG tablet Commonly known as:  PERCOCET/ROXICET Take 1-2 tablets by mouth every 4 (four) hours as needed for severe pain.   tamsulosin 0.4 MG Caps  capsule Commonly known as:  FLOMAX Take 1 capsule (0.4 mg total) by mouth daily.      Follow-up [facility], [place]. Schedule an appointment as soon as possible for a visit in 3 week(s).   Specialty:  General Surgery Contact information: [address] [place], MD, [facility] Office: [phone]   Signed: Earnstine Regal 04/14/2017, 9:37 AM

## 2017-04-18 ENCOUNTER — Other Ambulatory Visit: Payer: Self-pay | Admitting: Family Medicine

## 2017-04-18 DIAGNOSIS — Z1231 Encounter for screening mammogram for malignant neoplasm of breast: Secondary | ICD-10-CM

## 2017-05-03 ENCOUNTER — Ambulatory Visit
Admission: RE | Admit: 2017-05-03 | Discharge: 2017-05-03 | Disposition: A | Discharge status: Home / Self Care | Payer: 59 | Source: Ambulatory Visit | Attending: Family Medicine | Admitting: Family Medicine

## 2017-05-03 DIAGNOSIS — Z1231 Encounter for screening mammogram for malignant neoplasm of breast: Secondary | ICD-10-CM

## 2018-02-24 IMAGING — CT CT RENAL STONE PROTOCOL
2 of 4 series · 17 of 46 positions shown, 19 images · non-contrast
Comparison: 07/24/2014

CLINICAL DATA: Left flank pain

EXAM:
CT ABDOMEN AND PELVIS WITHOUT CONTRAST
TECHNIQUE: Multidetector CT imaging of the abdomen and pelvis was performed
following the standard protocol without IV contrast.

[Series 2: axial st · axial · 0.89mm/px · z∈[+576,+1022]mm · 14 of 97 slices shown, 16 images]
[im 4/97  soft-tissue]
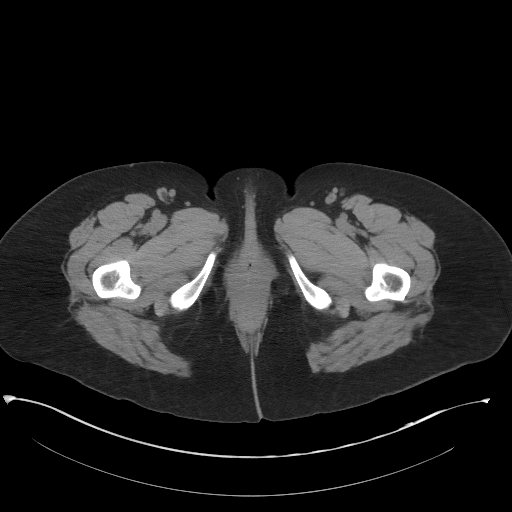
[im 4/97  bone]
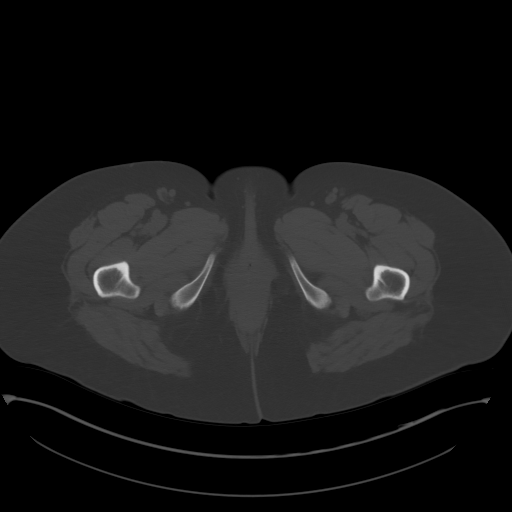
[im 12/97  soft-tissue]
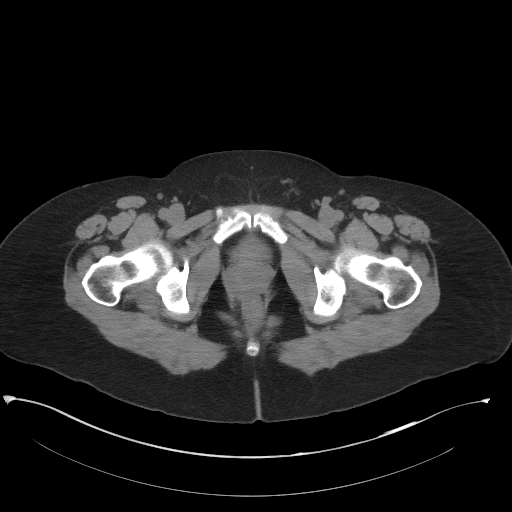
[im 20/97  soft-tissue]
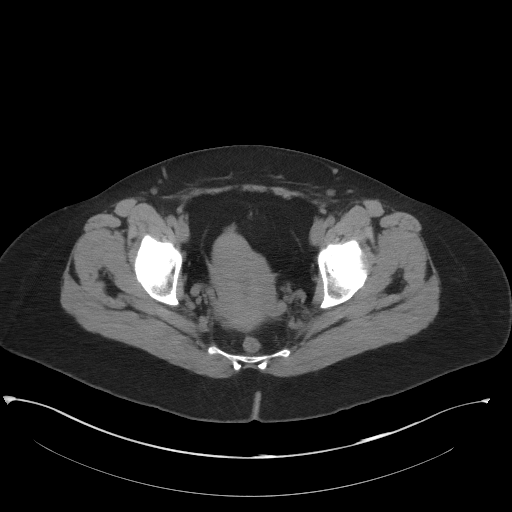
[im 27/97  soft-tissue]
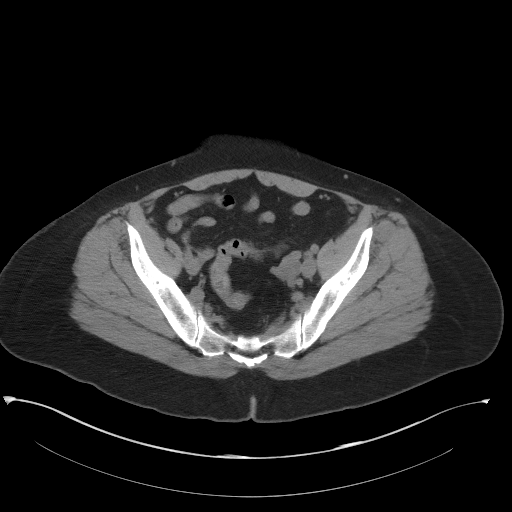
[im 31/97  soft-tissue]
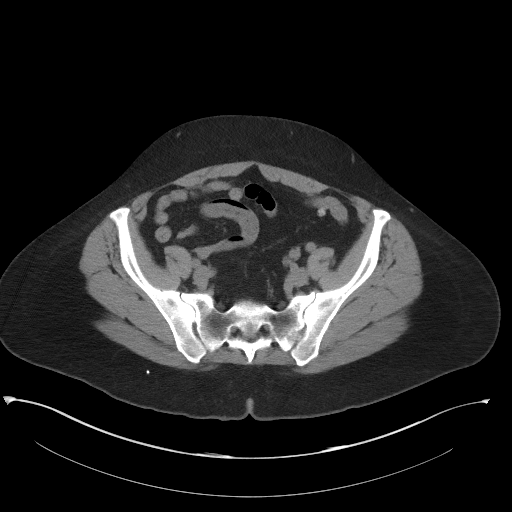
[im 39/97  soft-tissue]
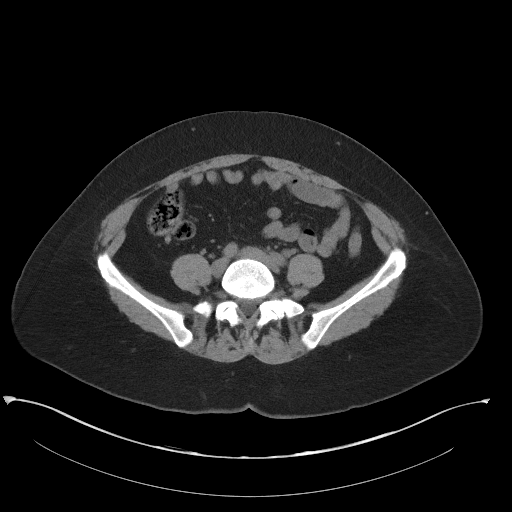
[im 47/97  soft-tissue]
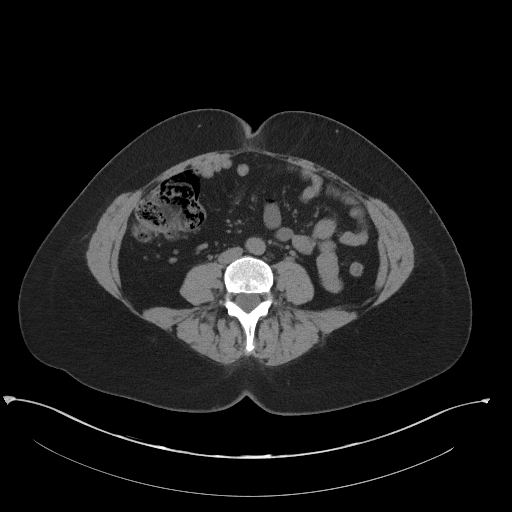
[im 50/97  soft-tissue]
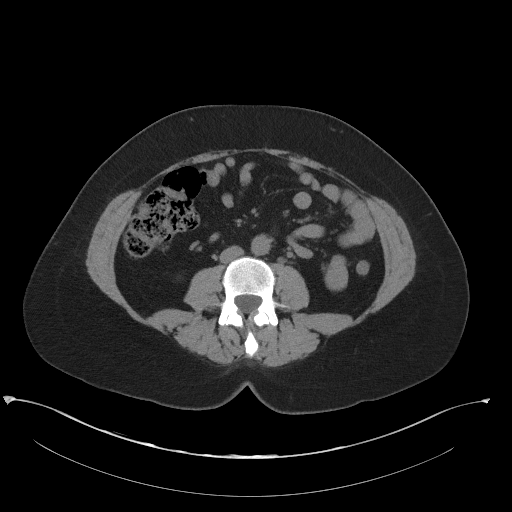
[im 58/97  soft-tissue]
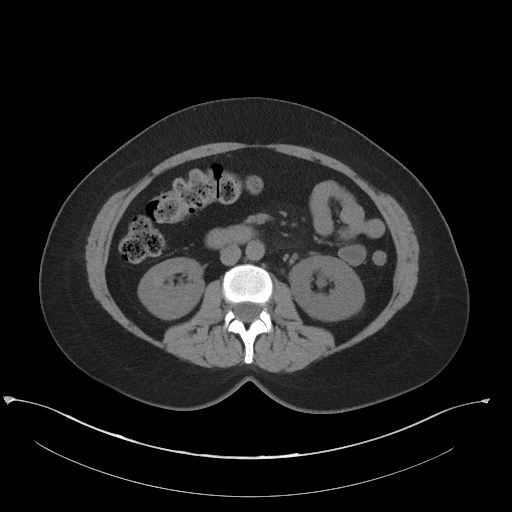
[im 58/97  bone]
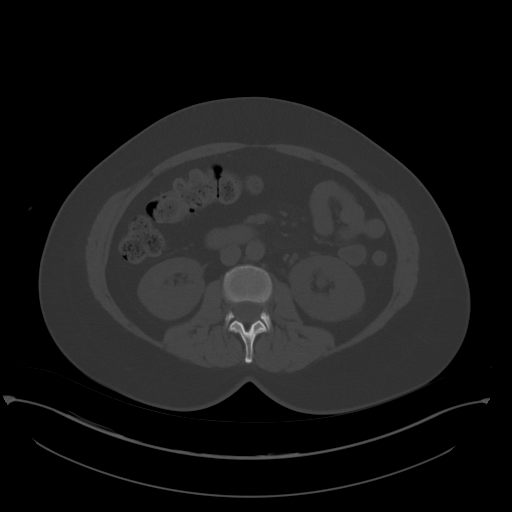
[im 66/97  soft-tissue]
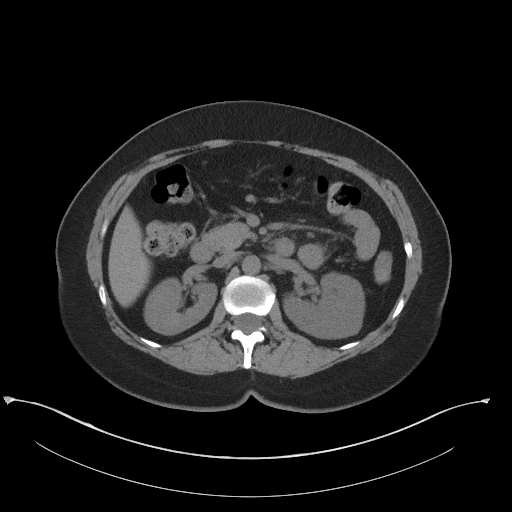
[im 73/97  soft-tissue]
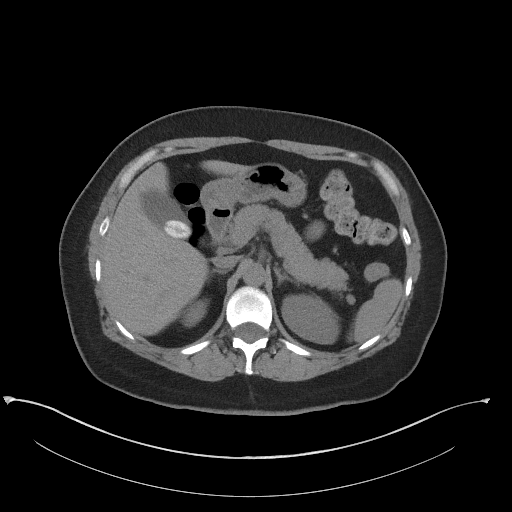
[im 77/97  soft-tissue]
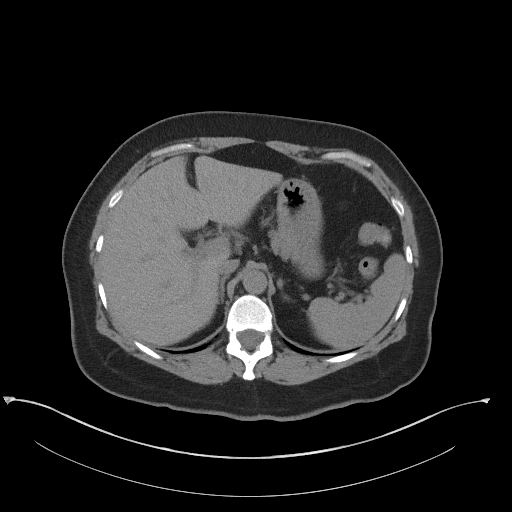
[im 85/97  soft-tissue]
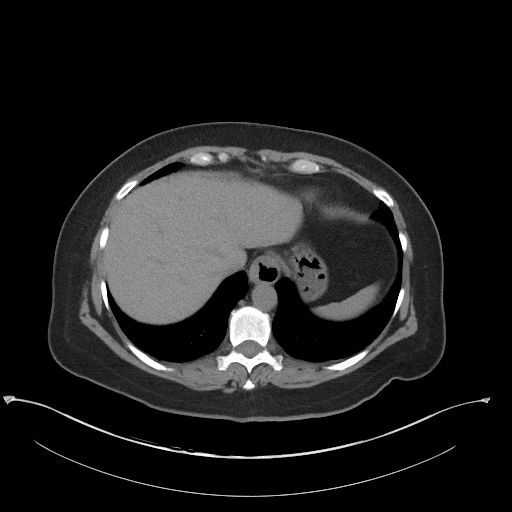
[im 93/97  soft-tissue]
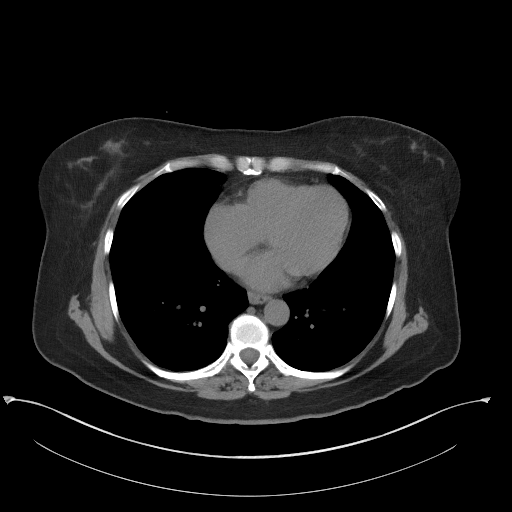

[Series 4: coronal st · coronal · 0.93mm/px · 3 of 96 slices shown]
[im 32/96  soft-tissue]
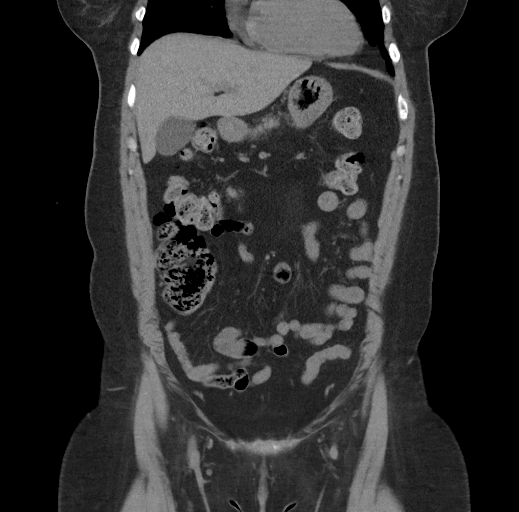
[im 43/96  soft-tissue]
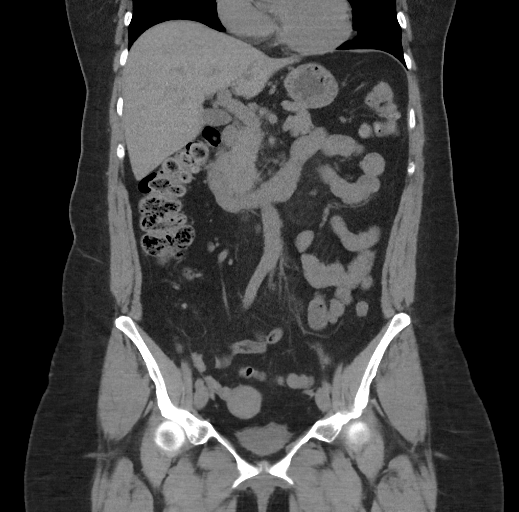
[im 53/96  soft-tissue]
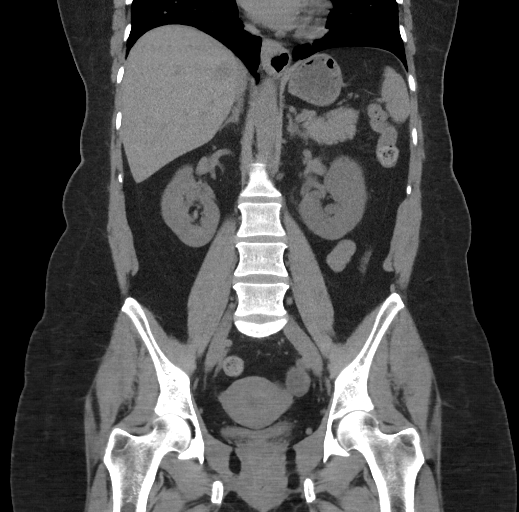

[17 of 46 positions shown; findings below may reference images not displayed]

FINDINGS: Lower chest: No acute abnormality.

Hepatobiliary: No focal liver abnormality. There is a stone within
the gallbladder measuring 2.3 cm, image 25 of series 2.

Pancreas: Unremarkable. No pancreatic ductal dilatation or
surrounding inflammatory changes.

Spleen: Normal appearance of the spleen.

Adrenals/Urinary Tract: The adrenal glands are unremarkable. Normal
appearance of the right kidney. No mass or hydronephrosis. There is
asymmetric left-sided nephromegaly and pelvocaliectasis. Inferior
pole calculus is identified measuring 4 mm, image number 42 of
series 2. There is left-sided hydroureter. Stone in the distal
ureter measures 3 mm, image 55 of series 4.

Stomach/Bowel: The stomach is normal. The small bowel loops have a
normal course and caliber. No bowel obstruction. No pathologic
dilatation of the colon.

Vascular/Lymphatic: Normal appearance of the abdominal aorta. No
aneurysm. No upper abdominal or pelvic adenopathy.

Reproductive: There are several uterine fibroids identified. The
largest arises from the fundus and measures 3 cm. No adnexal mass
identified.

Other: No abdominal wall hernia or abnormality. No abdominopelvic
ascites.

Musculoskeletal: No acute or significant osseous findings.
IMPRESSION: 1. Distal left ureteral calculus measures 3 mm and results in
left-sided nephromegaly and pelvocaliectasis.

## 2018-03-04 ENCOUNTER — Other Ambulatory Visit: Payer: Self-pay | Admitting: Family Medicine

## 2018-03-04 DIAGNOSIS — Z1231 Encounter for screening mammogram for malignant neoplasm of breast: Secondary | ICD-10-CM

## 2018-04-27 ENCOUNTER — Other Ambulatory Visit: Payer: Self-pay

## 2018-04-27 ENCOUNTER — Encounter (HOSPITAL_BASED_OUTPATIENT_CLINIC_OR_DEPARTMENT_OTHER): Payer: Self-pay | Admitting: Emergency Medicine

## 2018-04-27 ENCOUNTER — Emergency Department (HOSPITAL_BASED_OUTPATIENT_CLINIC_OR_DEPARTMENT_OTHER): Payer: 59

## 2018-04-27 ENCOUNTER — Emergency Department (HOSPITAL_BASED_OUTPATIENT_CLINIC_OR_DEPARTMENT_OTHER)
Admission: EM | Admit: 2018-04-27 | Discharge: 2018-04-27 | Disposition: A | Discharge status: Home / Self Care | Payer: 59 | Attending: Emergency Medicine | Admitting: Emergency Medicine

## 2018-04-27 DIAGNOSIS — I10 Essential (primary) hypertension: Secondary | ICD-10-CM | POA: Insufficient documentation

## 2018-04-27 DIAGNOSIS — M5442 Lumbago with sciatica, left side: Secondary | ICD-10-CM | POA: Diagnosis not present

## 2018-04-27 DIAGNOSIS — Z79899 Other long term (current) drug therapy: Secondary | ICD-10-CM | POA: Insufficient documentation

## 2018-04-27 DIAGNOSIS — M545 Low back pain: Secondary | ICD-10-CM | POA: Diagnosis present

## 2018-04-27 DIAGNOSIS — Z87442 Personal history of urinary calculi: Secondary | ICD-10-CM | POA: Diagnosis not present

## 2018-04-27 DIAGNOSIS — M4726 Other spondylosis with radiculopathy, lumbar region: Secondary | ICD-10-CM

## 2018-04-27 DIAGNOSIS — M4727 Other spondylosis with radiculopathy, lumbosacral region: Secondary | ICD-10-CM | POA: Diagnosis not present

## 2018-04-27 DIAGNOSIS — M5432 Sciatica, left side: Secondary | ICD-10-CM

## 2018-04-27 MED ORDER — NAPROXEN 500 MG PO TABS: 500.0000 mg | ORAL_TABLET | Freq: Two times a day (BID) | ORAL | 0 refills | Status: DC

## 2018-04-27 MED ORDER — CYCLOBENZAPRINE HCL 10 MG PO TABS: 10.0000 mg | ORAL_TABLET | Freq: Two times a day (BID) | ORAL | 0 refills | Status: DC | PRN

## 2018-04-27 MED ORDER — OXYCODONE-ACETAMINOPHEN 5-325 MG PO TABS
1.0000 | ORAL_TABLET | Freq: Once | ORAL | Status: AC
  Administered 2018-04-27: 1 via ORAL
  Filled 2018-04-27: qty 1

## 2018-04-27 MED ORDER — OXYCODONE-ACETAMINOPHEN 5-325 MG PO TABS: 1.0000 | ORAL_TABLET | Freq: Three times a day (TID) | ORAL | 0 refills | Status: DC | PRN

## 2018-04-27 NOTE — ED Notes (Signed)
Delay in xray. Went to get patient for exam and she wanted to wait on pain meds to start working. Told her I would return around 1240

## 2018-04-27 NOTE — Discharge Instructions (Signed)
Use Salonpas patches with lidocaine in the area of pain.

## 2018-04-27 NOTE — ED Triage Notes (Addendum)
Pt c/o low back pain radiating to her R hip since yesterday. Denies injury. Pt states she took oxycodone at 845 this morning without relief.

## 2018-04-27 NOTE — ED Provider Notes (Signed)
[facility] EMERGENCY DEPARTMENT Provider Note   CSN: [id number] Arrival date & time: 04/27/18  1039     History   Chief Complaint Chief Complaint  Patient presents with  . Back Pain    HPI Kendra Coleman is a 52 y.o. female with past medical history of hypertension, nephrolithiasis, who presents to ED for evaluation of left hip and left lower back pain radiating down the left leg since yesterday.  Symptoms began yesterday during the day.  She took a dose of ibuprofen and Tylenol with improvement in her symptoms.  However, last night she was folding closed when the pain worsened.  She took 1 dose of her daughter's oxycodone this morning with no relief in symptoms.  She has had intermittent pain in her left hip "on and off for years" which she attributed to old age.  She has not had this worked up in the past.  She states that it "pops and clicks" and improves on its own.  She denies any injuries or falls, prior back surgeries, history of cancer, history of IV drug use, numbness in legs, loss of bowel or bladder function, dysuria, hematuria or fever, chest pain or shortness of breath.  She has a history of kidney stones but states that this feels different.  HPI  Past Medical History:  Diagnosis Date  . Hypertension   . Kidney calculi     Patient Active Problem List   Diagnosis Date Noted  . S/P laparoscopic cholecystectomy 04/13/2017    Past Surgical History:  Procedure Laterality Date  . APPENDECTOMY    . CHOLECYSTECTOMY N/A 04/13/2017   Procedure: LAPAROSCOPIC CHOLECYSTECTOMY;  Surgeon: Ralene Ok, MD;  Location: [facility];  Service: General;  Laterality: N/A;     OB History   None      Home Medications    Prior to Admission medications   Medication Sig Start Date End Date Taking? Authorizing Provider  lisinopril-hydrochlorothiazide (PRINZIDE,ZESTORETIC) 20-12.5 MG tablet Take 1 tablet by mouth daily.   Yes [provider]  metoprolol  (LOPRESSOR) 100 MG tablet Take 100 mg by mouth daily.    Yes [provider]  omeprazole (PRILOSEC) 20 MG capsule Take 1 capsule (20 mg total) by mouth daily. 12/25/16  Yes Charlann Lange, PA-C  cyclobenzaprine (FLEXERIL) 10 MG tablet Take 1 tablet (10 mg total) by mouth 2 (two) times daily as needed for muscle spasms. 04/27/18   Ily Denno, PA-C  HYDROcodone-acetaminophen (NORCO/VICODIN) 5-325 MG tablet Take 1-2 tablets by mouth every 4 (four) hours as needed. Patient not taking: Reported on 04/13/2017 12/09/16   Ocie Cornfield T, PA-C  HYDROcodone-acetaminophen (NORCO/VICODIN) 5-325 MG tablet Take 1-2 tablets by mouth every 4 (four) hours as needed for moderate pain. 04/14/17   Armandina Gemma, MD  metoprolol succinate (TOPROL-XL) 100 MG 24 hr tablet Take 100 mg by mouth daily. Take with or immediately following a meal.    [provider]  naproxen (NAPROSYN) 500 MG tablet Take 1 tablet (500 mg total) by mouth 2 (two) times daily. 04/27/18   Ashby Moskal, PA-C  ondansetron (ZOFRAN) 4 MG tablet Take 1 tablet (4 mg total) by mouth every 6 (six) hours. Patient not taking: Reported on 12/24/2016 12/09/16   Ocie Cornfield T, PA-C  oxyCODONE-acetaminophen (PERCOCET/ROXICET) 5-325 MG tablet Take 1 tablet by mouth every 8 (eight) hours as needed for severe pain. 04/27/18   Dayshawn Irizarry, PA-C  tamsulosin (FLOMAX) 0.4 MG CAPS capsule Take 1 capsule (0.4 mg total) by mouth  daily. Patient not taking: Reported on 12/24/2016 12/09/16   Doristine Devoid, PA-C    Family History No family history on file.  Social History Social History   Tobacco Use  . Smoking status: Never Smoker  . Smokeless tobacco: Never Used  Substance Use Topics  . Alcohol use: Yes    Comment: occ  . Drug use: No     Allergies   Patient has no known allergies.   Review of Systems Review of Systems  Constitutional: Negative for appetite change, chills and fever.  HENT: Negative for ear pain, rhinorrhea, sneezing  and sore throat.   Eyes: Negative for photophobia and visual disturbance.  Respiratory: Negative for cough, chest tightness, shortness of breath and wheezing.   Cardiovascular: Negative for chest pain and palpitations.  Gastrointestinal: Negative for abdominal pain, blood in stool, constipation, diarrhea, nausea and vomiting.  Genitourinary: Negative for dysuria, hematuria and urgency.  Musculoskeletal: Positive for arthralgias, back pain and myalgias.  Skin: Negative for rash.  Neurological: Negative for dizziness, weakness and light-headedness.     Physical Exam Updated Vital Signs BP 94/63 (BP Location: Right Arm)   Pulse 78   Temp 98.3 F (36.8 C) (Oral)   Resp 18   Ht 5\' 8"  (1.727 m)   Wt 90.7 kg (200 lb)   LMP 03/31/2018   SpO2 99%   BMI 30.41 kg/m   Physical Exam  Constitutional: She appears well-developed and well-nourished. No distress.  HENT:  Head: Normocephalic and atraumatic.  Nose: Nose normal.  Eyes: Conjunctivae and EOM are normal. Left eye exhibits no discharge. No scleral icterus.  Neck: Normal range of motion. Neck supple.  Cardiovascular: Normal rate, regular rhythm, normal heart sounds and intact distal pulses. Exam reveals no gallop and no friction rub.  No murmur heard. Pulmonary/Chest: Effort normal and breath sounds normal. No respiratory distress.  Abdominal: Soft. Bowel sounds are normal. She exhibits no distension. There is no tenderness. There is no guarding.  Musculoskeletal: Normal range of motion. She exhibits no edema.       Back:       Legs: No midline spinal tenderness present in lumbar, thoracic or cervical spine. No step-off palpated. No visible bruising, edema or temperature change noted. No objective signs of numbness present. No saddle anesthesia. 2+ DP pulses bilaterally. Sensation intact to light touch. Strength 5/5 in bilateral lower extremities.  Neurological: She is alert. She exhibits normal muscle tone. Coordination normal.    Skin: Skin is warm and dry. No rash noted.  Psychiatric: She has a normal mood and affect.  Nursing note and vitals reviewed.    ED Treatments / Results  Labs (all labs ordered are listed, but only abnormal results are displayed) Labs Reviewed - No data to display  EKG None  Radiology Dg Lumbar Spine Complete  Result Date: 04/27/2018 CLINICAL DATA:  Lumbago EXAM: LUMBAR SPINE - COMPLETE 4+ VIEW COMPARISON:  None. FINDINGS: Frontal, lateral, spot lumbosacral lateral, and bilateral oblique views were obtained. The there are 5 non-rib-bearing lumbar type vertebral bodies. There is no evident fracture or spondylolisthesis. There is slight disc space narrowing at L5-S1. Other disc spaces appear normal. There is mild facet osteoarthritic change at L5-S1 bilaterally. IMPRESSION: Mild osteoarthritic change at L5-S1. No fracture or spondylolisthesis. Electronically Signed   By: Lowella Grip III M.D.   On: 04/27/2018 13:29   Dg Hip Unilat W Or Wo Pelvis 2-3 Views Left  Result Date: 04/27/2018 CLINICAL DATA:  Sudden onset left low back  and hip pain. No reported injury. EXAM: DG HIP (WITH OR WITHOUT PELVIS) 2-3V LEFT COMPARISON:  12/09/2016 CT abdomen/pelvis FINDINGS: No pelvic fracture or diastasis. No left hip fracture or dislocation. No suspicious focal osseous lesions. No significant hip arthropathy. No radiopaque foreign body. IMPRESSION: No fracture.  No significant hip arthropathy. Electronically Signed   By: Ilona Sorrel M.D.   On: 04/27/2018 13:28    Procedures Procedures (including critical care time)  Medications Ordered in ED Medications  oxyCODONE-acetaminophen (PERCOCET/ROXICET) 5-325 MG per tablet 1 tablet (1 tablet Oral Given 04/27/18 1159)     Initial Impression / Assessment and Plan / ED Course  I have reviewed the triage vital signs and the nursing notes.  Pertinent labs & imaging results that were available during my care of the patient were reviewed by me and  considered in my medical decision making (see chart for details).     52 year old female with a past medical history of hypertension, nephrolithiasis, who presents to ED for evaluation of left hip and left lower back pain radiating down the left leg since yesterday.  Reports some improvement with ibuprofen and Tylenol yesterday.  Symptoms got worse last night when she was folding close.  No improvement with her daughter's oxycodone this morning.  She had intermittent pain in her left hip on and off for years.  Denies any red flags for back pain including history of cancer, history of IV drug use, numbness in legs, loss of bowel or bladder function, dysuria, chest pain or shortness of breath.  She has a history of kidney stones but states that this feels different.  On physical exam she is overall well-appearing.  She does have some musculature tenderness to palpation with no changes to range of motion noted.  No midline tenderness to palpation.  No signs of saddle anesthesia.  Strength is equal in bilateral lower extremities.  X-ray of the hip and lower back shows degenerative changes with no acute abnormalities.  Suspect muscle strain with sciatica as a cause of her symptoms.  Doubt cauda equina, dissection, or other surgical or emergent cause of symptoms.  Will give symptomatic treatment with anti-inflammatories, short course of pain medication and muscle relaxer.  Also advised lidocaine patch in the area.  [place] PMP reviewed with no discrepancies. Advised to return to ED for any severe worsening symptoms.  Portions of this note were generated with [employer]. Dictation errors may occur despite best attempts at proofreading.   Final Clinical Impressions(s) / ED Diagnoses   Final diagnoses:  Sciatica of left side  Osteoarthritis of spine with radiculopathy, lumbar region    ED Discharge Orders        Ordered    oxyCODONE-acetaminophen (PERCOCET/ROXICET) 5-325 MG tablet  Every 8 hours  PRN     04/27/18 1340    naproxen (NAPROSYN) 500 MG tablet  2 times daily     04/27/18 1340    cyclobenzaprine (FLEXERIL) 10 MG tablet  2 times daily PRN     04/27/18 1340       Delia Heady, PA-C 04/27/18 1341    Dorie Rank, MD 04/27/18 1504

## 2018-05-06 ENCOUNTER — Ambulatory Visit
Admission: RE | Admit: 2018-05-06 | Discharge: 2018-05-06 | Disposition: A | Discharge status: Home / Self Care | Payer: 59 | Source: Ambulatory Visit | Attending: Family Medicine | Admitting: Family Medicine

## 2018-05-06 DIAGNOSIS — Z1231 Encounter for screening mammogram for malignant neoplasm of breast: Secondary | ICD-10-CM

## 2019-01-23 ENCOUNTER — Other Ambulatory Visit: Payer: Self-pay | Admitting: Family Medicine

## 2019-01-23 DIAGNOSIS — Z1231 Encounter for screening mammogram for malignant neoplasm of breast: Secondary | ICD-10-CM

## 2019-04-18 IMAGING — US US ABDOMEN LIMITED
1 series · 14 of 25 positions shown · non-contrast
Comparison: None.

CLINICAL DATA: Right upper quadrant pain.

EXAM:
US ABDOMEN LIMITED - RIGHT UPPER QUADRANT

[Series 1: us abdomen limited · 0.18mm/px · 14 of 51 slices shown]
[im 1/51]
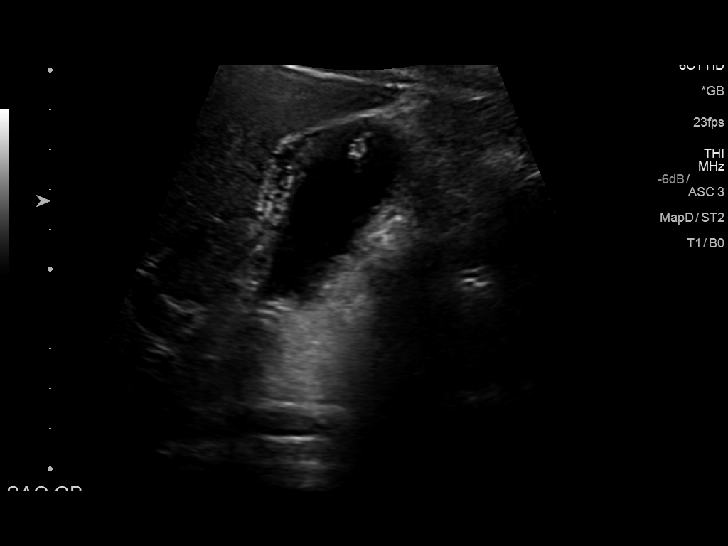
[im 5/51]
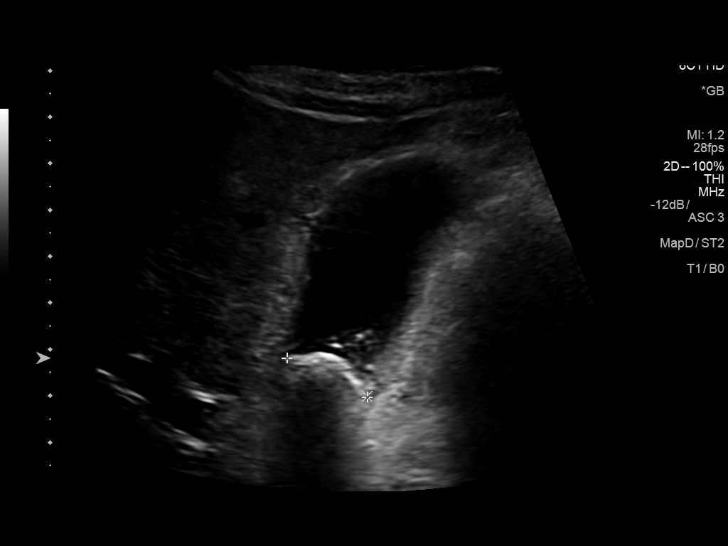
[im 9/51]
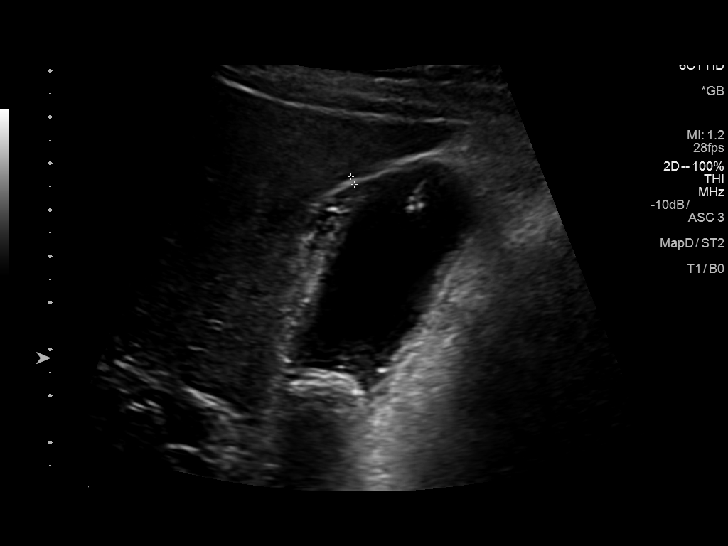
[im 13/51]
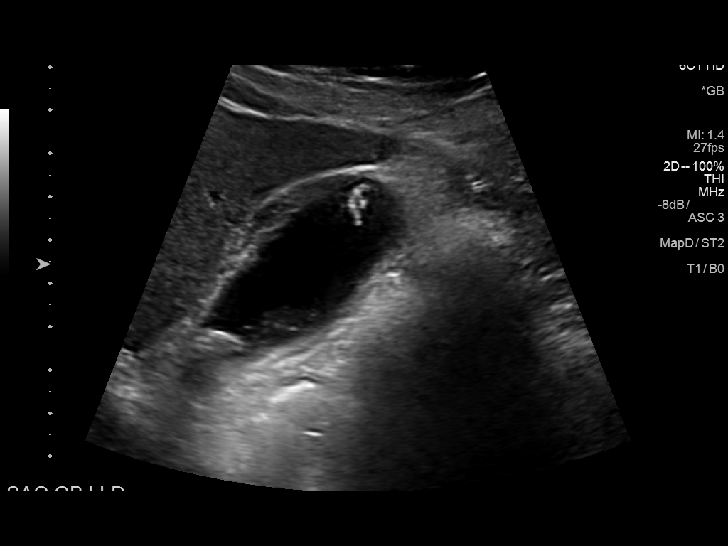
[im 17/51]
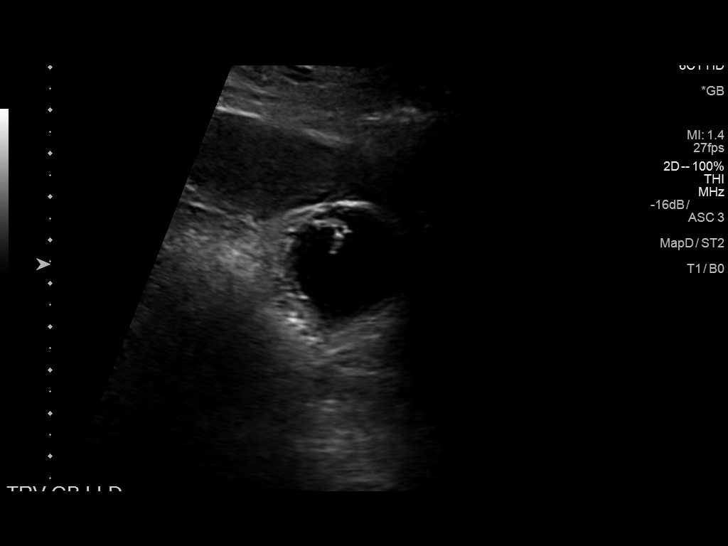
[im 19/51]
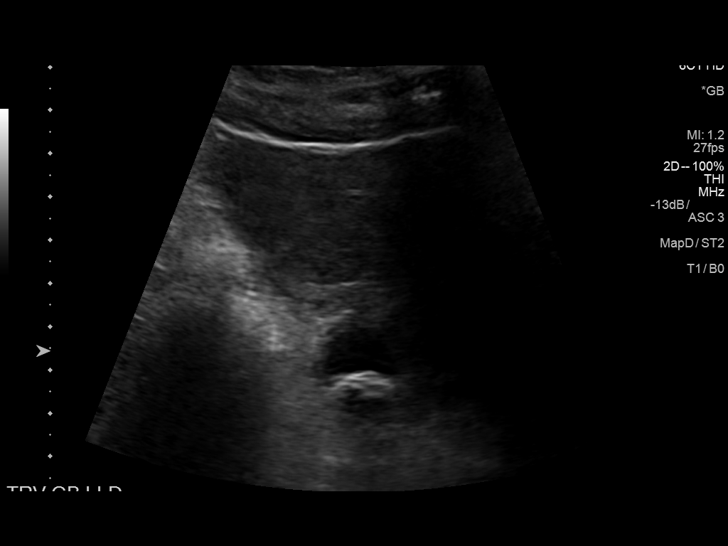
[im 23/51]
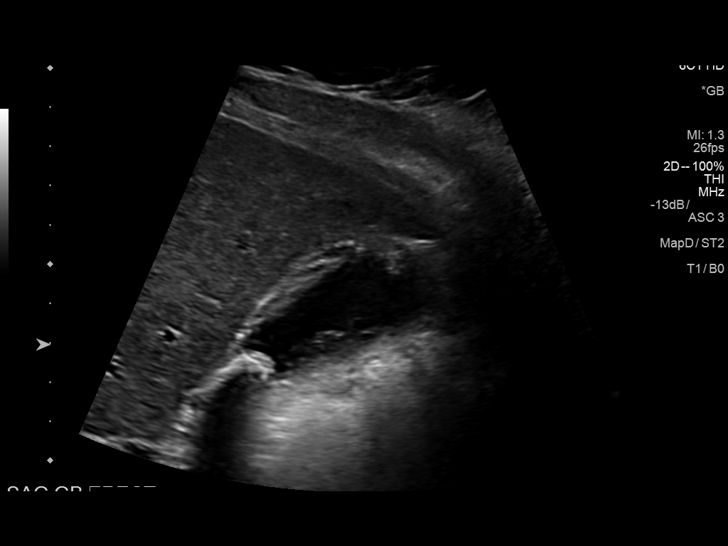
[im 28/51]
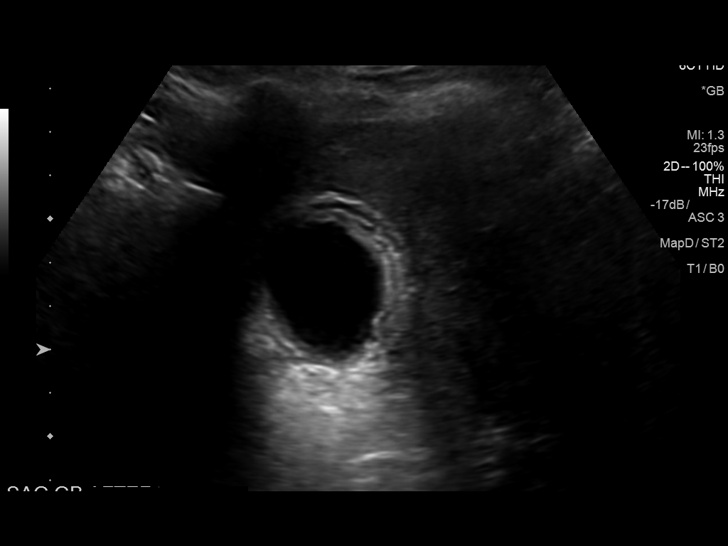
[im 32/51]
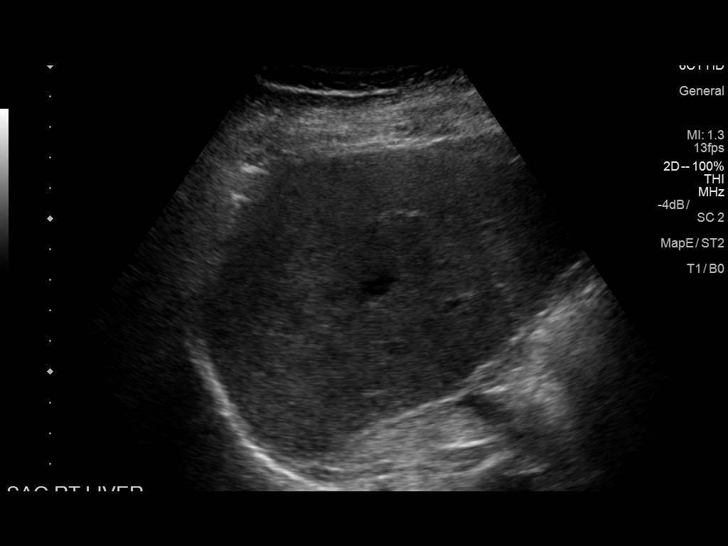
[im 34/51]
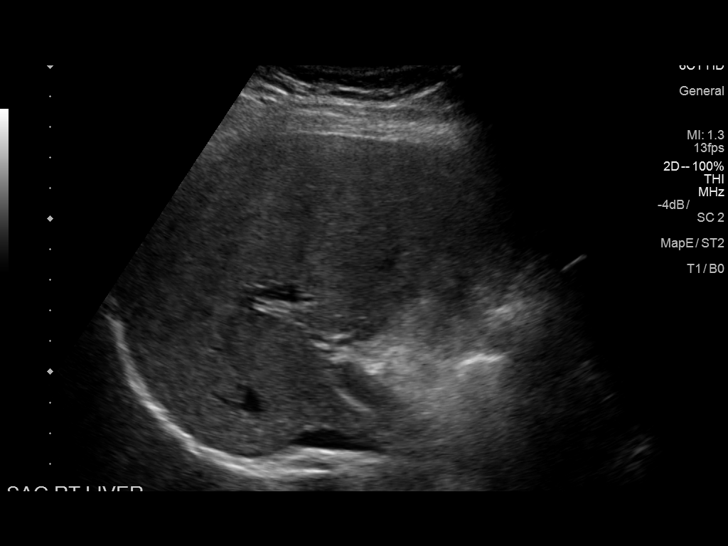
[im 38/51]
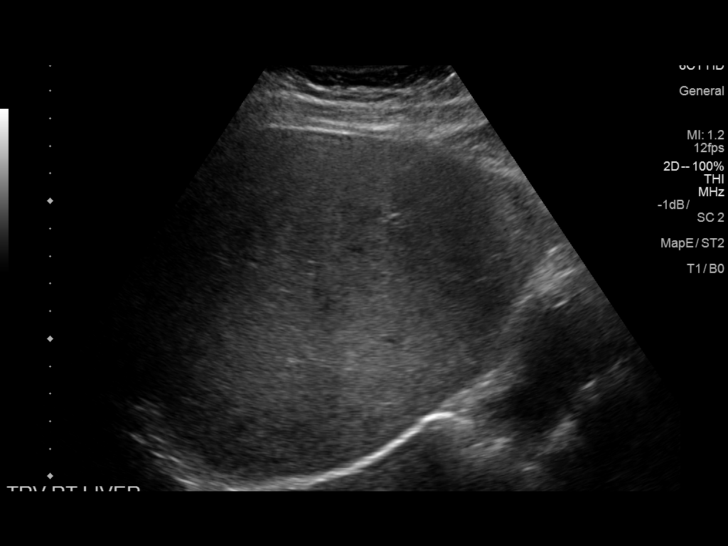
[im 42/51]
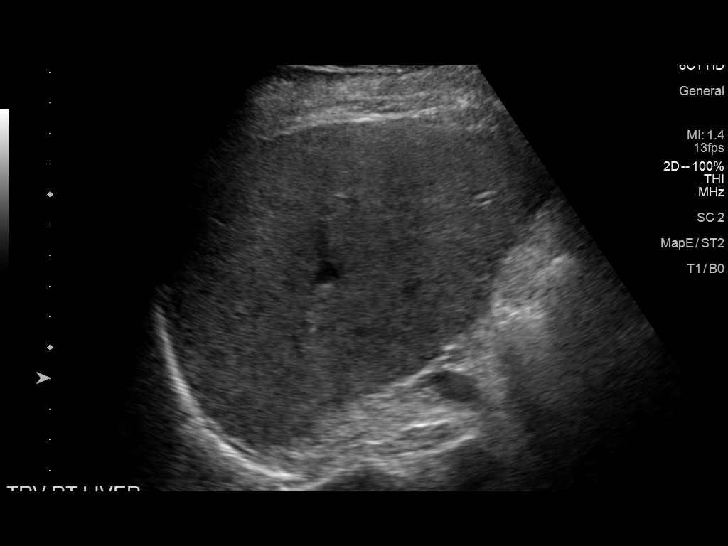
[im 46/51]
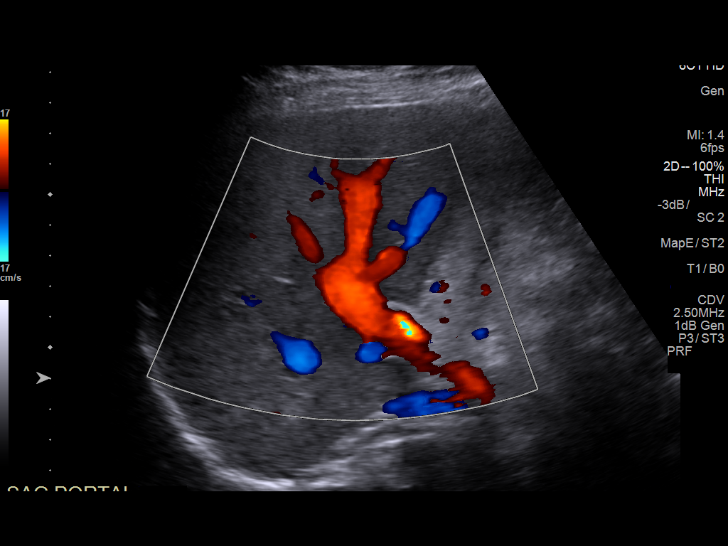
[im 51/51]
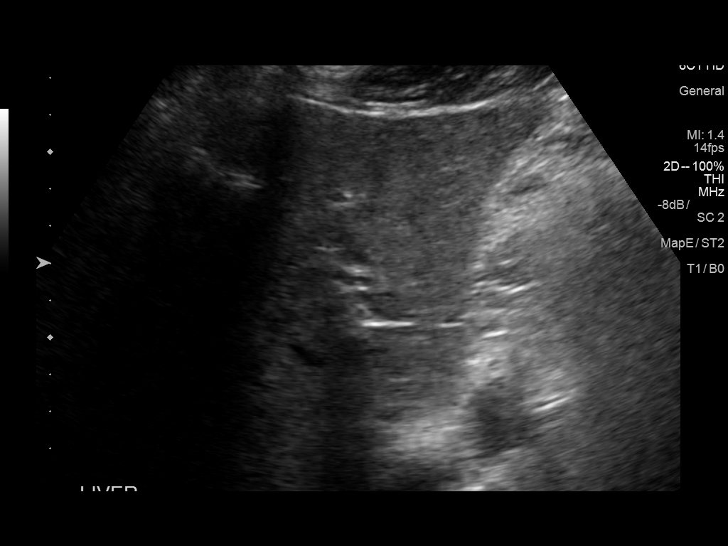

[14 of 25 positions shown; findings below may reference images not displayed]

FINDINGS: Gallbladder:

Cholelithiasis. Gallstone impacted in the gallbladder neck measuring
1.9 cm. 7 mm non mobile echogenic focus along the gallbladder wall
likely reflecting a small polyp. Gallbladder wall thickening
measuring 7.3 mm. No pericholecystic fluid. Small amount of
gallbladder sludge.

Common bile duct:

Diameter: 1.9 mm

Liver:

No focal lesion identified. Increased hepatic parenchymal
echogenicity. Normal hepatopetal flow in the portal vein.
IMPRESSION: 1. Cholelithiasis with a gallstone impacted in the gallbladder neck
measuring 2 cm. Gallbladder wall thickening measuring 7.3 mm with
fluid in the gallbladder wall. Findings most concerning for acute
cholecystitis.

## 2019-05-12 ENCOUNTER — Other Ambulatory Visit: Payer: Self-pay

## 2019-05-12 ENCOUNTER — Ambulatory Visit
Admission: RE | Admit: 2019-05-12 | Discharge: 2019-05-12 | Disposition: A | Discharge status: Home / Self Care | Payer: 59 | Source: Ambulatory Visit | Attending: Family Medicine | Admitting: Family Medicine

## 2019-05-12 DIAGNOSIS — Z1231 Encounter for screening mammogram for malignant neoplasm of breast: Secondary | ICD-10-CM

## 2020-03-24 ENCOUNTER — Other Ambulatory Visit: Payer: Self-pay | Admitting: Family Medicine

## 2020-03-24 DIAGNOSIS — Z1231 Encounter for screening mammogram for malignant neoplasm of breast: Secondary | ICD-10-CM

## 2020-06-03 ENCOUNTER — Ambulatory Visit: Payer: 59

## 2020-06-07 ENCOUNTER — Ambulatory Visit
Admission: RE | Admit: 2020-06-07 | Discharge: 2020-06-07 | Disposition: A | Discharge status: Home / Self Care | Payer: 59 | Source: Ambulatory Visit | Attending: Family Medicine | Admitting: Family Medicine

## 2020-06-07 ENCOUNTER — Other Ambulatory Visit: Payer: Self-pay

## 2020-06-07 DIAGNOSIS — Z1231 Encounter for screening mammogram for malignant neoplasm of breast: Secondary | ICD-10-CM

## 2020-06-09 ENCOUNTER — Other Ambulatory Visit: Payer: Self-pay | Admitting: Family Medicine

## 2020-06-09 DIAGNOSIS — R928 Other abnormal and inconclusive findings on diagnostic imaging of breast: Secondary | ICD-10-CM

## 2020-07-16 ENCOUNTER — Ambulatory Visit
Admission: RE | Admit: 2020-07-16 | Discharge: 2020-07-16 | Disposition: A | Discharge status: Home / Self Care | Payer: 59 | Source: Ambulatory Visit | Attending: Family Medicine | Admitting: Family Medicine

## 2020-07-16 ENCOUNTER — Other Ambulatory Visit: Payer: Self-pay

## 2020-07-16 DIAGNOSIS — R928 Other abnormal and inconclusive findings on diagnostic imaging of breast: Secondary | ICD-10-CM

## 2020-09-16 ENCOUNTER — Encounter: Payer: Self-pay | Admitting: Genetic Counselor

## 2020-09-23 ENCOUNTER — Inpatient Hospital Stay: Payer: BC Managed Care – PPO

## 2020-09-23 ENCOUNTER — Other Ambulatory Visit: Payer: Self-pay

## 2020-09-23 ENCOUNTER — Inpatient Hospital Stay: Payer: BC Managed Care – PPO | Attending: Genetic Counselor | Admitting: Genetic Counselor

## 2020-09-23 DIAGNOSIS — Z8042 Family history of malignant neoplasm of prostate: Secondary | ICD-10-CM

## 2020-09-23 DIAGNOSIS — Z807 Family history of other malignant neoplasms of lymphoid, hematopoietic and related tissues: Secondary | ICD-10-CM

## 2020-09-23 DIAGNOSIS — Z803 Family history of malignant neoplasm of breast: Secondary | ICD-10-CM

## 2020-09-23 DIAGNOSIS — Z8481 Family history of carrier of genetic disease: Secondary | ICD-10-CM

## 2020-09-24 ENCOUNTER — Encounter: Payer: Self-pay | Admitting: Genetic Counselor

## 2020-09-24 DIAGNOSIS — Z803 Family history of malignant neoplasm of breast: Secondary | ICD-10-CM | POA: Insufficient documentation

## 2020-09-24 DIAGNOSIS — Z807 Family history of other malignant neoplasms of lymphoid, hematopoietic and related tissues: Secondary | ICD-10-CM | POA: Insufficient documentation

## 2020-09-24 DIAGNOSIS — Z8042 Family history of malignant neoplasm of prostate: Secondary | ICD-10-CM | POA: Insufficient documentation

## 2020-09-24 DIAGNOSIS — Z8481 Family history of carrier of genetic disease: Secondary | ICD-10-CM | POA: Insufficient documentation

## 2020-09-27 NOTE — Progress Notes (Signed)
REFERRING PROVIDER: Gaynelle Arabian, MD [address] [place],  [place] [address]  PRIMARY PROVIDER:  Gaynelle Arabian, MD  PRIMARY REASON FOR VISIT:  1. Family history of gene mutation   2. Family history of multiple myeloma   3. Family history of prostate cancer   4. Family history of breast cancer   5. Family history of non-Hodgkin's lymphoma      HISTORY OF PRESENT ILLNESS:   Kendra Coleman, a 54 y.o. female, was seen for a [facility] cancer genetics consultation at the request of Dr. Marisue Humble due to a family history of cancer and a known MUTYH gene mutation.  Ms. Markov presents to clinic today to discuss the possibility of a hereditary predisposition to cancer, genetic testing, and to further clarify her future cancer risks, as well as potential cancer risks for family members.   Ms. Rothgeb does not have a personal history of cancer.    RISK FACTORS:  Menarche was at age 55.  First live birth at age 49.  OCP use for approximately 10 years.  Ovaries intact: yes.  Hysterectomy: no.  Menopausal status: premenopausal.  HRT use: 0 years. Colonoscopy: yes; 2020 normal. Mammogram within the last year: yes. Number of breast biopsies: 0. Any excessive radiation exposure in the past: no   Past Medical History:  Diagnosis Date  . Family history of breast cancer   . Family history of gene mutation   . Family history of multiple myeloma   . Family history of non-Hodgkin's lymphoma   . Family history of prostate cancer   . Hypertension   . Kidney calculi     Past Surgical History:  Procedure Laterality Date  . APPENDECTOMY    . CHOLECYSTECTOMY N/A 04/13/2017   Procedure: LAPAROSCOPIC CHOLECYSTECTOMY;  Surgeon: Ralene Ok, MD;  Location: [facility] OR;  Service: General;  Laterality: N/A;    Social History   Socioeconomic History  . Marital status: Married    Spouse name: Not on file  . Number of children: Not on file  . Years of education: Not on file    . Highest education level: Not on file  Occupational History  . Not on file  Tobacco Use  . Smoking status: Never Smoker  . Smokeless tobacco: Never Used  Substance and Sexual Activity  . Alcohol use: Yes    Comment: occ  . Drug use: No  . Sexual activity: Not on file  Other Topics Concern  . Not on file  Social History Narrative  . Not on file   Social Determinants of Health   Financial Resource Strain:   . Difficulty of Paying Living Expenses: Not on file  Food Insecurity:   . Worried About Charity fundraiser in the Last Year: Not on file  . Ran Out of Food in the Last Year: Not on file  Transportation Needs:   . Lack of Transportation (Medical): Not on file  . Lack of Transportation (Non-Medical): Not on file  Physical Activity:   . Days of Exercise per Week: Not on file  . Minutes of Exercise per Session: Not on file  Stress:   . Feeling of Stress : Not on file  Social Connections:   . Frequency of Communication with Friends and Family: Not on file  . Frequency of Social Gatherings with Friends and Family: Not on file  . Attends Religious Services: Not on file  . Active Member of Clubs or Organizations: Not on file  . Attends Club  or Organization Meetings: Not on file  . Marital Status: Not on file     FAMILY HISTORY:  We obtained a detailed, 4-generation family history.  Significant diagnoses are listed below: Family History  Problem Relation Age of Onset  . Breast cancer Maternal Aunt        dx early 6s  . Breast cancer Paternal Aunt        dx 67s  . Cancer Paternal Aunt 55       "rare and aggressive" gyn cancer  . Other Paternal Aunt        MUTYH gene mutation  . Breast cancer Paternal Grandmother        dx 11s  . Multiple myeloma Father 52  . Prostate cancer Father 78  . Other Father        MUTYH gene mutation c.1187G>A  . Non-Hodgkin's lymphoma Maternal Grandmother 76  . Other Cousin        MUTYH gene mutation c.1187G>A (paternal first cousin)    Kendra Coleman has two daughters (ages 64 and 6). She had one maternal half-brother who died recently at the age of 56. She also has a paternal half-sister (age 53). None of these family members have had cancer.  Kendra Coleman mother is 66 and has not had cancer. She had one maternal uncle and three maternal aunts. One aunt was diagnosed with breast cancer in her early 33s. Her maternal grandmother died at age 69 and had non-hodgkins lymphoma (first diagnosed age 3). Her maternal grandmother died in his 44s.   Kendra Coleman father died recently at the age of 33 and had multiple myeloma (diagnosed age 30) and prostate cancer (diagnosed age 60). He had genetic testing that was positive for one mutation in the MUTYH gene (c.1187G>A). Kendra Coleman had two paternal uncles and one paternal aunt. Her aunt has a history of breast cancer (diagnosed in her 60s) and was recently diagnosed with a "rare and aggressive" gynecologic cancer at the age of 35. This aunt also tested positive for the MUTYH mutation. This aunt had two children - one tested positive for the MUTYH mutation and the other tested negative. Kendra Coleman paternal grandmother died in her 38s and had a history of breast cancer in her 2s. Her paternal grandfather died in his 56s.   Kendra Coleman is aware of previous family history of genetic testing for hereditary cancer risks. Patient's ancestors are of unknown descent. There is no reported Ashkenazi Jewish ancestry. There is no known consanguinity.  GENETIC COUNSELING ASSESSMENT: Kendra Coleman is a 54 y.o. female with a family history of a known MUTYH mutation. We, therefore, discussed and recommended the following at today's visit.   DISCUSSION: We discussed that Kendra Coleman has a 50% (1 in 2) chance to also have the MUTYH mutation that was discovered in her father, paternal aunt, and paternal cousin. We discussed that mutations in the MUTYH gene are associated with an autosomal  recessive predisposition to colon polyps and colon cancer called MUTYH-associated polyposis (MAP). Because MAP is an autosomal recessive condition, an individual needs to inherit two MUTYH mutations (one from each parent) in order to be affected with MAP. Individuals with only one MUTYH mutation are not affected with MAP but may have a marginally increased risk for colon cancer.   We discussed that testing is beneficial for multiple reasons including knowing about potential cancer risks and identifying screening and risk-reduction options that may be appropriate.  We reviewed the characteristics, features and inheritance  patterns of hereditary cancer syndromes. We also discussed genetic testing, including the appropriate family members to test, the process of testing, insurance coverage, genetic discrimination, and turn-around-time for results. We discussed the implications of a negative vs a positive result.  In particular, we discussed that even if her results are negative, she may still be at an increased risk for certain cancers based on the family history.    We recommended Ms. Etcheverry pursue genetic testing for the known familial variant in MUTYH, called c.1187G>A, through [facility].   Based on Ms. Delmundo's family history of a known genetic mutation, she meets medical criteria for genetic testing. Despite that she meets criteria, there may still be an out of pocket cost. We discussed that if her out of pocket cost for testing is over $100, the laboratory will reach out to let her know. If the out of pocket cost of testing is less than $100 she will be billed by the genetic testing laboratory.   PLAN: After considering the risks, benefits, and limitations, Ms. Ireland provided informed consent to pursue genetic testing and the blood sample was sent to [facility] for analysis of the MUTYH gene. Results should be available within approximately two-three weeks' time, at which  point they will be disclosed by telephone to Ms. Teuscher, as will any additional recommendations warranted by these results. Ms. Gohlke will receive a summary of her genetic counseling visit and a copy of her results once available. This information will also be available in Epic.   Ms. Herringshaw questions were answered to her satisfaction today. Our contact information was provided should additional questions or concerns arise. Thank you for the referral and allowing [place] to share in the care of your patient.   Clint Guy, [place], [facility] Licensed, Certified Dispensing [email or web address] Phone: [phone]  The patient was seen for a total of 35 minutes in face-to-face genetic counseling.  This patient was discussed with Drs. Magrinat, Lindi Adie and/or Burr Medico who agrees with the above.    _______________________________________________________________________ For Office Staff:  Number of people involved in session: 1 Was an Intern/ student involved with case: no

## 2020-10-04 DIAGNOSIS — Z1379 Encounter for other screening for genetic and chromosomal anomalies: Secondary | ICD-10-CM | POA: Insufficient documentation

## 2020-10-06 ENCOUNTER — Telehealth: Payer: Self-pay | Admitting: Genetic Counselor

## 2020-10-06 NOTE — Telephone Encounter (Signed)
LVM that her genetic test results are available and requested that she call back to discuss them.  

## 2020-10-07 ENCOUNTER — Telehealth: Payer: Self-pay | Admitting: Genetic Counselor

## 2020-10-07 ENCOUNTER — Ambulatory Visit: Payer: Self-pay | Admitting: Genetic Counselor

## 2020-10-07 DIAGNOSIS — Z1589 Genetic susceptibility to other disease: Secondary | ICD-10-CM

## 2020-10-07 DIAGNOSIS — Z1379 Encounter for other screening for genetic and chromosomal anomalies: Secondary | ICD-10-CM

## 2020-10-07 NOTE — Telephone Encounter (Signed)
Revealed positive genetic test results. 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   Since she has only one pathogenic mutation in MUTYH, she is NOT affected with autosomal recessive MUTYH-associated polyposis, but instead is a carrier. Because she has no family history of colon cancer, no specialized screening is warranted per NCCN guidelines (v1.2021).

## 2020-10-08 ENCOUNTER — Encounter: Payer: Self-pay | Admitting: Genetic Counselor

## 2020-10-11 DIAGNOSIS — Z1589 Genetic susceptibility to other disease: Secondary | ICD-10-CM | POA: Insufficient documentation

## 2020-10-11 NOTE — Progress Notes (Signed)
GENETIC TEST RESULTS   Patient Name: Kendra Coleman Patient Age: 54 y.o. Encounter Date: 10/07/2020  Referring Provider: Gaynelle Arabian, MD [address] [place],  [place] [address]   HPI: Ms. Dix was previously seen in the [facility] clinic due to a family history of cancer and a known MUTYH mutation and concerns regarding a hereditary predisposition to cancer. Please refer to our prior cancer genetics clinic note for more information regarding Ms. Deboer's medical, social and family histories, and our assessment and recommendations, at the time. Ms. Worthey recent genetic test results were disclosed to her, as were recommendations warranted by these results. These results and recommendations are discussed in more detail below.   FAMILY HISTORY:  We obtained a detailed, 4-generation family history.  Significant diagnoses are listed below: Family History  Problem Relation Age of Onset  . Breast cancer Maternal Aunt        dx early 69s  . Breast cancer Paternal Aunt        dx 38s  . Cancer Paternal Aunt 31       "rare and aggressive" gyn cancer  . Other Paternal Aunt        MUTYH gene mutation  . Breast cancer Paternal Grandmother        dx 41s  . Multiple myeloma Father 29  . Prostate cancer Father 74  . Other Father        MUTYH gene mutation c.1187G>A  . Non-Hodgkin's lymphoma Maternal Grandmother 76  . Other Cousin        MUTYH gene mutation c.1187G>A (paternal first cousin)   Ms. Mcinnis has two daughters (ages 54 and 36). She had one maternal half-brother who died recently at the age of 70. She also has a paternal half-sister (age 46). None of these family members have had cancer.  Ms. Bourdeau mother is 1 and has not had cancer. She had one maternal uncle and three maternal aunts. One aunt was diagnosed with breast cancer in her early 40s. Her maternal grandmother died at age 50 and had non-hodgkins lymphoma (first  diagnosed age 70). Her maternal grandmother died in his 39s.   Ms. Dagley father died recently at the age of 37 and had multiple myeloma (diagnosed age 27) and prostate cancer (diagnosed age 59). He had genetic testing that was positive for one mutation in the MUTYH gene (c.1187G>A). Ms. Weitzman had two paternal uncles and one paternal aunt. Her aunt has a history of breast cancer (diagnosed in her 50s) and was recently diagnosed with a "rare and aggressive" gynecologic cancer at the age of 28. This aunt also tested positive for the MUTYH mutation. This aunt had two children - one tested positive for the MUTYH mutation and the other tested negative. Ms. Laye paternal grandmother died in her 16s and had a history of breast cancer in her 76s. Her paternal grandfather died in his 6s.   Ms. Moss is aware of previous family history of genetic testing for hereditary cancer risks. Patient's ancestors are of unknown descent. There is no reported Ashkenazi Jewish ancestry. There is no known consanguinity.  GENETIC TEST RESULTS:  We recommended Ms. Bruck pursue testing for the familial pathogenic variant in the MUTYH gene called c.1187G>A. 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   Pathogenic variants in the MUTYH gene are associated with autosomal recessvie MUTYH-associated  polyposis (MAP). Since Ms. 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   A copy of the test report will be scanned into Epic and located under the Molecular Pathology section of the Results Review tab.  A portion of the result report is included below for reference.    Ms. Ambers genetic test result does not explain why she has a family history of cancer. We discussed with Ms. Lizarraga that because  current genetic testing is not perfect, it is possible there may be a gene mutation in a gene that current testing cannot detect, but that chance is small.  We also discussed that there could be another gene that has not yet been discovered, or that we have not yet tested, that is responsible for the cancer diagnoses in the family. It is also possible there is a hereditary cause for the cancer in the family that Ms. Mcgibbon did not inherit and therefore was not identified in her testing. Therefore, it is important to remain in touch with cancer genetics in the future so that we can continue to offer Ms. Panther the most up to date genetic testing.  DISCUSSION OF RESULTS & RECOMMENDATIONS:  We discussed the implications of a heterozygous MUTYH mutation with Ms. Delbuono, and discussed who else in the family should have genetic testing. We recommended Ms. Yurick follow the most updated medical management guidelines (NCCN Guidelines Genetic/Familial High Risk Assessment: Colorectal v1.2021) for heterozygous MUTYH mutations; all of which are outlined below. These can be coordinated by Ms. Thebes GI doctor or primary care provider, as applicable. Because Ms. Kook does not have a personal or family history of colon cancer, no specialized colon cancer screening is warranted based on this genetic test result.     For individuals unaffected with colorectal cancer with a fist-degree relative with colorectal cancer: High-quality colonoscopy every 5 years starting at age 7 or 7 years younger than the earliest age of colorectal cancer onset, whichever is younger.  For individuals with a second-degree relative with colorectal cancer: There are no specific data available to determine screening recommendations for a patient with an MUTYH heterozygous pathogenic variant and a second-degree relative affected with colorectal cancer.  For individuals unaffected by colorectal cancer with NO family history of  colorectal cancer: Data is uncertain on whether specialized screening is warranted.   Although heterozygous MUTYH mutation carriers may have a slightly increased risk for colon cancer, this result does not explain Ms. Priola's family history. An individual's cancer risk and medical management are not determined by genetic test results alone. Overall cancer risk assessment incorporates additional factors, including personal medical history, family history, and any available genetic information that may result in a personalized plan for cancer prevention and surveillance.  Based on Ms. Waincscott's personal and family history, as well as her genetic test results, a statistical model Midwife) was used to estimate her risk of developing breast cancer. Tyrer-Cuzick estimates her lifetime risk of developing breast cancer to be approximately 15.6-22.8% (depending on breast density).  This lifetime breast cancer risk is a preliminary estimate based on available information using one of several models endorsed by the [place] (ACS). The ACS recommends consideration of breast MRI screening as an adjunct to mammography for patients at high risk (defined as 20% or greater lifetime risk). A more detailed breast cancer risk assessment can be considered, if clinically indicated.    Ms. Magistro has been determined to  be at high risk for breast cancer.  Therefore, we recommend that she have annual breast MRIs in addition to annual mammograms.  We discussed that Ms. Senner should discuss her individual situation with her referring physician and determine a breast cancer screening plan with which they are both comfortable.       FAMILY MEMBERS: Since we now know the MUTYH mutation in Ms. Godwin, we can test at-risk relatives to determine whether or not they have inherited the mutation and have an increased chance to have a child with MUTYH-associated polyposis. We will be happy to meet with  any of the family members or refer them to a genetic counselor in their local area. To locate genetic counselors in other cities, individuals can visit the website "[email or web address]" and search for a cancer genetic counselor by zip code.    We encouraged Ms. Julia to remain in contact with [place] in cancer genetics on an annual basis so we can update Ms. Caras's personal and family histories, and inform her of advances in cancer genetics that may be of benefit for the entire family. Ms. Losh is welcome to call with any questions or concerns, at any time.    Clint Guy, [place], [facility] Licensed, Certified Dispensing [email or web address] Phone: [phone]

## 2020-12-20 ENCOUNTER — Ambulatory Visit
Admission: RE | Admit: 2020-12-20 | Discharge: 2020-12-20 | Disposition: A | Discharge status: Home / Self Care | Payer: BC Managed Care – PPO | Source: Ambulatory Visit | Attending: Physician Assistant | Admitting: Physician Assistant

## 2020-12-20 ENCOUNTER — Other Ambulatory Visit: Payer: Self-pay | Admitting: Physician Assistant

## 2020-12-20 DIAGNOSIS — R059 Cough, unspecified: Secondary | ICD-10-CM

## 2021-07-11 ENCOUNTER — Other Ambulatory Visit: Payer: Self-pay | Admitting: Physician Assistant

## 2021-07-11 DIAGNOSIS — Z1231 Encounter for screening mammogram for malignant neoplasm of breast: Secondary | ICD-10-CM

## 2021-08-29 ENCOUNTER — Ambulatory Visit: Payer: BC Managed Care – PPO

## 2021-10-20 ENCOUNTER — Ambulatory Visit
Admission: RE | Admit: 2021-10-20 | Discharge: 2021-10-20 | Disposition: A | Discharge status: Home / Self Care | Payer: BC Managed Care – PPO | Source: Ambulatory Visit | Attending: Physician Assistant | Admitting: Physician Assistant

## 2021-10-20 ENCOUNTER — Other Ambulatory Visit: Payer: Self-pay

## 2021-10-20 DIAGNOSIS — Z1231 Encounter for screening mammogram for malignant neoplasm of breast: Secondary | ICD-10-CM

## 2021-11-20 DIAGNOSIS — M545 Low back pain, unspecified: Secondary | ICD-10-CM

## 2021-11-20 DIAGNOSIS — R251 Tremor, unspecified: Secondary | ICD-10-CM

## 2021-11-20 HISTORY — DX: Tremor, unspecified: R25.1

## 2021-11-20 HISTORY — DX: Low back pain, unspecified: M54.50

## 2022-01-25 ENCOUNTER — Other Ambulatory Visit: Payer: Self-pay | Admitting: Neurosurgery

## 2022-01-25 DIAGNOSIS — M5440 Lumbago with sciatica, unspecified side: Secondary | ICD-10-CM

## 2022-02-03 ENCOUNTER — Ambulatory Visit
Admission: RE | Admit: 2022-02-03 | Discharge: 2022-02-03 | Disposition: A | Discharge status: Home / Self Care | Payer: BC Managed Care – PPO | Source: Ambulatory Visit | Attending: Neurosurgery | Admitting: Neurosurgery

## 2022-02-03 DIAGNOSIS — M5440 Lumbago with sciatica, unspecified side: Secondary | ICD-10-CM

## 2022-06-13 ENCOUNTER — Other Ambulatory Visit: Payer: Self-pay | Admitting: Physician Assistant

## 2022-06-13 DIAGNOSIS — R109 Unspecified abdominal pain: Secondary | ICD-10-CM

## 2022-07-07 ENCOUNTER — Ambulatory Visit
Admission: RE | Admit: 2022-07-07 | Discharge: 2022-07-07 | Disposition: A | Discharge status: Home / Self Care | Payer: BC Managed Care – PPO | Source: Ambulatory Visit | Attending: Physician Assistant | Admitting: Physician Assistant

## 2022-07-07 DIAGNOSIS — R109 Unspecified abdominal pain: Secondary | ICD-10-CM

## 2022-07-07 MED ORDER — IOPAMIDOL (ISOVUE-370) INJECTION 76%
80.0000 mL | Freq: Once | INTRAVENOUS | Status: AC | PRN
  Administered 2022-07-07: 80 mL via INTRAVENOUS

## 2022-09-20 ENCOUNTER — Encounter (HOSPITAL_BASED_OUTPATIENT_CLINIC_OR_DEPARTMENT_OTHER): Payer: Self-pay | Admitting: Obstetrics and Gynecology

## 2022-09-20 ENCOUNTER — Other Ambulatory Visit: Payer: Self-pay

## 2022-09-20 NOTE — Progress Notes (Signed)
Your procedure is scheduled on Friday, 09/29/22.  Report to [facility].   Call this number if you have problems the morning of surgery  :[phone].   OUR ADDRESS IS [place].  WE ARE LOCATED IN [facility].  PLEASE BRING YOUR INSURANCE CARD AND PHOTO ID DAY OF SURGERY.  ONLY 2 PEOPLE ARE ALLOWED IN  WAITING  ROOM.                                      REMEMBER:  DO NOT EAT FOOD, CANDY GUM OR MINTS  AFTER MIDNIGHT THE NIGHT BEFORE YOUR SURGERY . YOU MAY HAVE CLEAR LIQUIDS FROM MIDNIGHT THE NIGHT BEFORE YOUR SURGERY UNTIL  4:30 AM. NO CLEAR LIQUIDS AFTER   4:30 AM DAY OF SURGERY.  YOU MAY  BRUSH YOUR TEETH MORNING OF SURGERY AND RINSE YOUR MOUTH OUT, NO CHEWING GUM CANDY OR MINTS.     CLEAR LIQUID DIET   Foods Allowed                                                                     Foods Excluded  Coffee and tea, regular and decaf                             liquids that you cannot  Plain Jell-O                                                                   see through such as: Fruit ices (not with fruit pulp)                                     milk, soups, orange juice  Plain  Popsicles                                    All solid food Carbonated beverages, regular and diet                                    Cranberry, grape and apple juices Sports drinks like Gatorade _____________________________________________________________________     TAKE ONLY THESE MEDICATIONS MORNING OF SURGERY:  Omeprazole, Metoprolol    UP TO 4 VISITORS  MAY VISIT IN THE EXTENDED RECOVERY ROOM UNTIL 800 PM ONLY.  ONE  VISITOR AGE 48 AND OVER MAY SPEND THE NIGHT AND MUST BE IN EXTENDED RECOVERY ROOM NO LATER THAN 800 PM . YOUR DISCHARGE TIME AFTER YOU SPEND THE NIGHT IS 900 AM THE MORNING AFTER YOUR SURGERY.  YOU MAY PACK A SMALL OVERNIGHT BAG WITH TOILETRIES FOR YOUR  OVERNIGHT STAY IF YOU WISH.  YOUR PRESCRIPTION  MEDICATIONS WILL BE PROVIDED DURING [place].                                      DO NOT WEAR JEWERLY, MAKE UP. DO NOT WEAR LOTIONS, POWDERS, PERFUMES OR NAIL POLISH ON YOUR FINGERNAILS. TOENAIL POLISH IS OK TO WEAR. DO NOT SHAVE FOR 48 HOURS PRIOR TO DAY OF SURGERY. MEN MAY SHAVE FACE AND NECK. CONTACTS, GLASSES, OR DENTURES MAY NOT BE WORN TO SURGERY.  REMEMBER: NO SMOKING, DRUGS OR ALCOHOL FOR 24 HOURS BEFORE YOUR SURGERY.                                    [facility] IS NOT RESPONSIBLE  FOR ANY BELONGINGS.                                                                    Marland Kitchen           [facility] - Preparing for Surgery Before surgery, you can play an important role.  Because skin is not sterile, your skin needs to be as free of germs as possible.  You can reduce the number of germs on your skin by washing with CHG (chlorahexidine gluconate) soap before surgery.  CHG is an antiseptic cleaner which kills germs and bonds with the skin to continue killing germs even after washing. Please DO NOT use if you have an allergy to CHG or antibacterial soaps.  If your skin becomes reddened/irritated stop using the CHG and inform your nurse when you arrive at Short Stay. Do not shave (including legs and underarms) for at least 48 hours prior to the first CHG shower.  You may shave your face/neck. Please follow these instructions carefully:  1.  Shower with CHG Soap the night before surgery and the  morning of Surgery.  2.  If you choose to wash your hair, wash your hair first as usual with your  normal  shampoo.  3.  After you shampoo, rinse your hair and body thoroughly to remove the  shampoo.                                        4.  Use CHG as you would any other liquid soap.  You can apply chg directly  to the skin and wash , chg soap provided, night before and morning of your surgery.  5.  Apply the CHG Soap to your body ONLY FROM THE NECK DOWN.   Do not use on face/ open                            Wound or open sores. Avoid contact with eyes, ears mouth and genitals (private parts).                       Wash face,  Genitals (private parts) with your normal soap.  6.  Wash thoroughly, paying special attention to the area where your surgery  will be performed.  7.  Thoroughly rinse your body with warm water from the neck down.  8.  DO NOT shower/wash with your normal soap after using and rinsing off  the CHG Soap.             9.  Pat yourself dry with a clean towel.            10.  Wear clean pajamas.            11.  Place clean sheets on your bed the night of your first shower and do not  sleep with pets. Day of Surgery : Do not apply any lotions/deodorants the morning of surgery.  Please wear clean clothes to the hospital/surgery center.  IF YOU HAVE ANY SKIN IRRITATION OR PROBLEMS WITH THE SURGICAL SOAP, PLEASE GET A BAR OF GOLD DIAL SOAP AND SHOWER THE NIGHT BEFORE YOUR SURGERY AND THE MORNING OF YOUR SURGERY. PLEASE LET THE NURSE KNOW MORNING OF YOUR SURGERY IF YOU HAD ANY PROBLEMS WITH THE SURGICAL SOAP.   ________________________________________________________________________                                                        QUESTIONS [facility] PRE OP NURSE PHONE [phone].

## 2022-09-20 NOTE — Progress Notes (Signed)
Spoke w/ via phone for pre-op interview---[facility] needs dos----urine pregnancy per anesthesia, surgeon orders pending as of 09/20/22               Lab results------09/26/22 lab appt for cbc, bmp, ekg, type & screen COVID test -----patient states asymptomatic no test needed Arrive at -------0530 on Friday, 09/29/22 NPO after MN NO Solid Food.  Clear liquids from MN until---0430 Med rec completed Medications to take morning of surgery -----Omeprazole, Metoprolol Diabetic medication -----n/a Patient instructed no nail polish to be worn day of surgery Patient instructed to bring photo id and insurance card day of surgery Patient aware to have Driver (ride ) / caregiver    for 24 hours after surgery - husband, [facility] Patient Special Instructions -----Extended / overnight stay instructions given. Pre-Op special Istructions -----Requested orders from Dr. Sandford Craze via Epic IB on 09/20/22. Patient verbalized understanding of instructions that were given at this phone interview. Patient denies shortness of breath, chest pain, fever, cough at this phone interview.

## 2022-09-22 DIAGNOSIS — Z01818 Encounter for other preprocedural examination: Secondary | ICD-10-CM | POA: Diagnosis not present

## 2022-09-24 NOTE — H&P (Signed)
Kendra Coleman is an 56 y.o. female with Abnormal Uterine Bleeding and uterine fibroids.  In evaluation EMB was benign.  [place] and CT scan 1.3-3.8cm fibroids, some pedunculated.  Anteverted uterus, nl ovaries.  D/W pt r/b/a of hysterectomy, robot assisted and Bilateral salpingectomy, possible cystoscopy.  Also d/w pt process and expectations.    Pertinent Gynecological History: G2P2 SVD x 2 Has had BTL 10/21 last pap HR HPV neg; no abn pap No STD  Menstrual History:  Patient's last menstrual period was 07/04/2022 (approximate).    Past Medical History:  Diagnosis Date   Anxiety    Follows with Ricka Burdock, PA @ [facility]   Family history of breast cancer    Family history of gene mutation    Family history of multiple myeloma    Family history of non-Hodgkin's lymphoma    Family history of prostate cancer    Fibroid, uterine    GERD (gastroesophageal reflux disease)    Currently taking Omeprazole as of 09/20/2022.   History of kidney stones    around 2016   Hypertension    Follows with Ricka Burdock, PA @ [facility]   Low back pain 2023   s/p 3 steroid injections   Monoallelic mutation of MUTYH gene    Shakiness 2023   Pt states she has recently had some episodes of feeling shaky if she gets hungry or hasn't eaten a lot that day.    Past Surgical History:  Procedure Laterality Date   APPENDECTOMY  1994   CHOLECYSTECTOMY N/A 04/13/2017   Procedure: LAPAROSCOPIC CHOLECYSTECTOMY;  Surgeon: Ralene Ok, MD;  Location: [facility];  Service: General;  Laterality: N/A;   TUBAL LIGATION  2003    Family History  Problem Relation Age of Onset   Breast cancer Maternal Aunt        dx early 80s   Breast cancer Paternal Aunt        dx 64s   Cancer Paternal Aunt 81       "rare and aggressive" gyn cancer   Other Paternal Aunt        MUTYH gene mutation   Breast cancer Paternal Grandmother        dx 18s   Multiple myeloma Father 68   Prostate cancer Father 24    Other Father        MUTYH gene mutation c.1187G>A   Non-Hodgkin's lymphoma Maternal Grandmother 76   Other Cousin        MUTYH gene mutation c.1187G>A (paternal first cousin)    Social History:  reports that she quit smoking about 24 years ago. Her smoking use included cigarettes. She has a 7.50 pack-year smoking history. She has never used smokeless tobacco. She reports current alcohol use. She reports that she does not use drugs. married  Allergies: No Known Allergies  Meds: Clorazepate 7.51m, HCTZ 12.5, Toprol 1072m omeprazole, probiotic, Valtrex prn  Review of Systems  Constitutional: Negative.   HENT: Negative.    Respiratory: Negative.    Cardiovascular: Negative.   Gastrointestinal: Negative.   Genitourinary:  Positive for menstrual problem and pelvic pain.  Musculoskeletal: Negative.   Skin: Negative.   Neurological: Negative.   Hematological: Negative.   Psychiatric/Behavioral: Negative.      Height _0  (1.702 m), weight 93 kg, last menstrual period 07/04/2022. Physical Exam Constitutional:      Appearance: Normal appearance.  HENT:     Head: Normocephalic and atraumatic.  Cardiovascular:     Rate and Rhythm:  Normal rate and regular rhythm.     Pulses: Normal pulses.     Heart sounds: Normal heart sounds.  Pulmonary:     Effort: Pulmonary effort is normal.     Breath sounds: Normal breath sounds.  Abdominal:     General: Bowel sounds are normal.     Palpations: Abdomen is soft.  Genitourinary:    General: Normal vulva.  Musculoskeletal:        General: Normal range of motion.     Cervical back: Neck supple.  Skin:    General: Skin is warm and dry.  Neurological:     General: No focal deficit present.     Mental Status: She is alert and oriented to person, place, and time.  Psychiatric:        Mood and Affect: Mood normal.        Behavior: Behavior normal.    EMB benign,   US/CT 1.3-3.8cm fibroids, questionable enlargement; (fundal subserosal,  posterior pedunc, R lateral posterior pedunculated, anteverted uterus, nl ovaries   Assessment/Plan: 56yo G2P2 with uterine fibroids and AUB for hysterectomy - robot assisted, B salpingectomy, possible cystoscopy D/w pt r/b/a of surgery Will proceed  Hansford Hirt Bovard-Stuckert 09/24/2022, 12:39 PM

## 2022-09-26 ENCOUNTER — Encounter (HOSPITAL_COMMUNITY)
Admission: RE | Admit: 2022-09-26 | Discharge: 2022-09-26 | Disposition: A | Discharge status: Home / Self Care | Payer: 59 | Source: Ambulatory Visit | Attending: Obstetrics and Gynecology | Admitting: Obstetrics and Gynecology

## 2022-09-26 DIAGNOSIS — Z01818 Encounter for other preprocedural examination: Secondary | ICD-10-CM | POA: Insufficient documentation

## 2022-09-26 LAB — COMPREHENSIVE METABOLIC PANEL
ALT: 29 U/L (ref 0–44)
AST: 20 U/L (ref 15–41)
Albumin: 3.9 g/dL (ref 3.5–5.0)
Alkaline Phosphatase: 98 U/L (ref 38–126)
Anion gap: 8 (ref 5–15)
BUN: 18 mg/dL (ref 6–20)
CO2: 27 mmol/L (ref 22–32)
Calcium: 9.1 mg/dL (ref 8.9–10.3)
Chloride: 109 mmol/L (ref 98–111)
Creatinine, Ser: 0.6 mg/dL (ref 0.44–1.00)
GFR, Estimated: >60 mL/min (ref >60)
Glucose, Bld: 101 mg/dL — ABNORMAL HIGH (ref 70–99)
Potassium: 3.3 mmol/L — ABNORMAL LOW (ref 3.5–5.1)
Sodium: 144 mmol/L (ref 135–145)
Total Bilirubin: 0.3 mg/dL (ref 0.3–1.2)
Total Protein: 6.7 g/dL (ref 6.5–8.1)

## 2022-09-26 LAB — CBC
HCT: 39 % (ref 36.0–46.0)
Hemoglobin: 12.6 g/dL (ref 12.0–15.0)
MCH: 27.8 pg (ref 26.0–34.0)
MCHC: 32.3 g/dL (ref 30.0–36.0)
MCV: 86.1 fL (ref 80.0–100.0)
Platelets: 264 10*3/uL (ref 150–400)
RBC: 4.53 MIL/uL (ref 3.87–5.11)
RDW: 14.1 % (ref 11.5–15.5)
WBC: 6.2 10*3/uL (ref 4.0–10.5)
nRBC: 0 % (ref 0.0–0.2)

## 2022-09-28 ENCOUNTER — Encounter (HOSPITAL_BASED_OUTPATIENT_CLINIC_OR_DEPARTMENT_OTHER): Payer: Self-pay | Admitting: Obstetrics and Gynecology

## 2022-09-28 NOTE — Anesthesia Preprocedure Evaluation (Addendum)
Anesthesia Evaluation  Patient identified by MRN, date of birth, ID band Patient awake    Reviewed: Allergy & Precautions, NPO status , Patient's Chart, lab work & pertinent test results, reviewed documented beta blocker date and time   Airway Mallampati: II       Dental no notable dental hx. (+) Teeth Intact   Pulmonary former smoker   Pulmonary exam normal breath sounds clear to auscultation       Cardiovascular hypertension, Pt. on medications Normal cardiovascular exam Rhythm:Regular Rate:Normal     Neuro/Psych   Anxiety     negative neurological ROS     GI/Hepatic Neg liver ROS,GERD  Medicated,,  Endo/Other  Obesity  Renal/GU Hx/o renal calculi  negative genitourinary   Musculoskeletal   Abdominal  (+) + obese  Peds  Hematology negative hematology ROS (+)   Anesthesia Other Findings   Reproductive/Obstetrics Fibroid uterus  AUB                              Anesthesia Physical Anesthesia Plan  ASA: 2  Anesthesia Plan: General   Post-op Pain Management: Toradol IV (intra-op)*, Precedex, Tylenol PO (pre-op)*, Dilaudid IV and Celebrex PO (pre-op)*   Induction: Intravenous  PONV Risk Score and Plan: 4 or greater and Treatment may vary due to age or medical condition, Scopolamine patch - Pre-op, Midazolam, Ondansetron and Dexamethasone  Airway Management Planned: Oral ETT  Additional Equipment: None  Intra-op Plan:   Post-operative Plan: Extubation in OR  Informed Consent: I have reviewed the patients History and Physical, chart, labs and discussed the procedure including the risks, benefits and alternatives for the proposed anesthesia with the patient or authorized representative who has indicated his/her understanding and acceptance.     Dental advisory given  Plan Discussed with: CRNA and Anesthesiologist  Anesthesia Plan Comments:          Anesthesia Quick  Evaluation

## 2022-09-28 NOTE — Progress Notes (Signed)
Patient understands arrival time of 0700 and surgery scheduled at 0900.

## 2022-09-29 ENCOUNTER — Observation Stay (HOSPITAL_BASED_OUTPATIENT_CLINIC_OR_DEPARTMENT_OTHER)
Admission: RE | Admit: 2022-09-29 | Discharge: 2022-09-29 | Disposition: A | Discharge status: Home / Self Care | Payer: 59 | Attending: Obstetrics and Gynecology | Admitting: Obstetrics and Gynecology

## 2022-09-29 ENCOUNTER — Other Ambulatory Visit: Payer: Self-pay

## 2022-09-29 ENCOUNTER — Encounter (HOSPITAL_BASED_OUTPATIENT_CLINIC_OR_DEPARTMENT_OTHER)
Admission: RE | Disposition: A | Discharge status: Home / Self Care | Payer: Self-pay | Source: Home / Self Care | Attending: Obstetrics and Gynecology

## 2022-09-29 ENCOUNTER — Ambulatory Visit (HOSPITAL_BASED_OUTPATIENT_CLINIC_OR_DEPARTMENT_OTHER): Payer: 59 | Admitting: Anesthesiology

## 2022-09-29 ENCOUNTER — Encounter (HOSPITAL_BASED_OUTPATIENT_CLINIC_OR_DEPARTMENT_OTHER): Payer: Self-pay | Admitting: Obstetrics and Gynecology

## 2022-09-29 DIAGNOSIS — Z01818 Encounter for other preprocedural examination: Secondary | ICD-10-CM

## 2022-09-29 DIAGNOSIS — Z90710 Acquired absence of both cervix and uterus: Secondary | ICD-10-CM | POA: Diagnosis present

## 2022-09-29 DIAGNOSIS — N879 Dysplasia of cervix uteri, unspecified: Secondary | ICD-10-CM | POA: Diagnosis not present

## 2022-09-29 DIAGNOSIS — N72 Inflammatory disease of cervix uteri: Secondary | ICD-10-CM | POA: Insufficient documentation

## 2022-09-29 DIAGNOSIS — Z87891 Personal history of nicotine dependence: Secondary | ICD-10-CM | POA: Diagnosis not present

## 2022-09-29 DIAGNOSIS — I10 Essential (primary) hypertension: Secondary | ICD-10-CM | POA: Diagnosis not present

## 2022-09-29 DIAGNOSIS — N8003 Adenomyosis of the uterus: Secondary | ICD-10-CM | POA: Diagnosis not present

## 2022-09-29 DIAGNOSIS — N939 Abnormal uterine and vaginal bleeding, unspecified: Secondary | ICD-10-CM | POA: Diagnosis not present

## 2022-09-29 DIAGNOSIS — D251 Intramural leiomyoma of uterus: Principal | ICD-10-CM | POA: Insufficient documentation

## 2022-09-29 DIAGNOSIS — D259 Leiomyoma of uterus, unspecified: Secondary | ICD-10-CM | POA: Diagnosis not present

## 2022-09-29 DIAGNOSIS — D252 Subserosal leiomyoma of uterus: Secondary | ICD-10-CM | POA: Diagnosis not present

## 2022-09-29 HISTORY — DX: Gastro-esophageal reflux disease without esophagitis: K21.9

## 2022-09-29 HISTORY — DX: Leiomyoma of uterus, unspecified: D25.9

## 2022-09-29 HISTORY — DX: Personal history of urinary calculi: Z87.442

## 2022-09-29 HISTORY — DX: Anxiety disorder, unspecified: F41.9

## 2022-09-29 HISTORY — PX: ROBOTIC ASSISTED LAPAROSCOPIC HYSTERECTOMY AND SALPINGECTOMY: SHX6379

## 2022-09-29 HISTORY — DX: Genetic susceptibility to other disease: Z15.89

## 2022-09-29 LAB — BASIC METABOLIC PANEL
Anion gap: 6 (ref 5–15)
BUN: 19 mg/dL (ref 6–20)
CO2: 28 mmol/L (ref 22–32)
Calcium: 8.7 mg/dL — ABNORMAL LOW (ref 8.9–10.3)
Chloride: 106 mmol/L (ref 98–111)
Creatinine, Ser: 0.76 mg/dL (ref 0.44–1.00)
GFR, Estimated: >60 mL/min (ref >60)
Glucose, Bld: 182 mg/dL — ABNORMAL HIGH (ref 70–99)
Potassium: 3.6 mmol/L (ref 3.5–5.1)
Sodium: 140 mmol/L (ref 135–145)

## 2022-09-29 LAB — TYPE AND SCREEN
ABO/RH(D): AB POS
Antibody Screen: NEGATIVE

## 2022-09-29 LAB — CBC
HCT: 36.8 % (ref 36.0–46.0)
Hemoglobin: 11.8 g/dL — ABNORMAL LOW (ref 12.0–15.0)
MCH: 28 pg (ref 26.0–34.0)
MCHC: 32.1 g/dL (ref 30.0–36.0)
MCV: 87.2 fL (ref 80.0–100.0)
Platelets: 223 10*3/uL (ref 150–400)
RBC: 4.22 MIL/uL (ref 3.87–5.11)
RDW: 14.2 % (ref 11.5–15.5)
WBC: 11 10*3/uL — ABNORMAL HIGH (ref 4.0–10.5)
nRBC: 0 % (ref 0.0–0.2)

## 2022-09-29 LAB — POCT PREGNANCY, URINE: Preg Test, Ur: NEGATIVE

## 2022-09-29 LAB — ABO/RH: ABO/RH(D): AB POS

## 2022-09-29 SURGERY — XI ROBOTIC ASSISTED LAPAROSCOPIC HYSTERECTOMY AND SALPINGECTOMY
Anesthesia: General | Site: Abdomen | Laterality: Bilateral

## 2022-09-29 MED ORDER — CEFAZOLIN SODIUM-DEXTROSE 2-4 GM/100ML-% IV SOLN
2.0000 g | INTRAVENOUS | Status: AC
  Administered 2022-09-29: 2 g via INTRAVENOUS

## 2022-09-29 MED ORDER — MIDAZOLAM HCL 2 MG/2ML IJ SOLN
INTRAMUSCULAR | Status: DC | PRN
  Administered 2022-09-29: 2 mg via INTRAVENOUS

## 2022-09-29 MED ORDER — AMISULPRIDE (ANTIEMETIC) 5 MG/2ML IV SOLN: 10.0000 mg | Freq: Once | INTRAVENOUS | Status: DC | PRN

## 2022-09-29 MED ORDER — SODIUM CHLORIDE 0.9 % IR SOLN
Status: DC | PRN
  Administered 2022-09-29: 1000 mL

## 2022-09-29 MED ORDER — PROPOFOL 500 MG/50ML IV EMUL
INTRAVENOUS | Status: DC | PRN
  Administered 2022-09-29: 25 ug/kg/min via INTRAVENOUS

## 2022-09-29 MED ORDER — HYDROMORPHONE HCL 1 MG/ML IJ SOLN: 0.2000 mg | INTRAMUSCULAR | Status: DC | PRN

## 2022-09-29 MED ORDER — DIPHENHYDRAMINE HCL 50 MG/ML IJ SOLN
INTRAMUSCULAR | Status: AC
  Filled 2022-09-29: qty 1

## 2022-09-29 MED ORDER — PHENYLEPHRINE HCL-NACL 20-0.9 MG/250ML-% IV SOLN
INTRAVENOUS | Status: DC | PRN
  Administered 2022-09-29: 15 ug/min via INTRAVENOUS

## 2022-09-29 MED ORDER — ROCURONIUM BROMIDE 10 MG/ML (PF) SYRINGE
PREFILLED_SYRINGE | INTRAVENOUS | Status: DC | PRN
  Administered 2022-09-29: 80 mg via INTRAVENOUS
  Administered 2022-09-29: 20 mg via INTRAVENOUS

## 2022-09-29 MED ORDER — LACTATED RINGERS IV SOLN: INTRAVENOUS | Status: DC | PRN

## 2022-09-29 MED ORDER — OXYCODONE-ACETAMINOPHEN 5-325 MG PO TABS
1.0000 | ORAL_TABLET | ORAL | Status: DC | PRN
  Administered 2022-09-29: 1 via ORAL

## 2022-09-29 MED ORDER — ACETAMINOPHEN 500 MG PO TABS
ORAL_TABLET | ORAL | Status: AC
  Filled 2022-09-29: qty 2

## 2022-09-29 MED ORDER — OXYCODONE HCL 5 MG PO TABS: 5.0000 mg | ORAL_TABLET | Freq: Once | ORAL | Status: DC | PRN

## 2022-09-29 MED ORDER — FENTANYL CITRATE (PF) 250 MCG/5ML IJ SOLN
INTRAMUSCULAR | Status: AC
  Filled 2022-09-29: qty 5

## 2022-09-29 MED ORDER — MIDAZOLAM HCL 2 MG/2ML IJ SOLN
INTRAMUSCULAR | Status: AC
  Filled 2022-09-29: qty 2

## 2022-09-29 MED ORDER — ONDANSETRON HCL 4 MG/2ML IJ SOLN
INTRAMUSCULAR | Status: DC | PRN
  Administered 2022-09-29: 4 mg via INTRAVENOUS

## 2022-09-29 MED ORDER — SCOPOLAMINE 1 MG/3DAYS TD PT72
MEDICATED_PATCH | TRANSDERMAL | Status: AC
  Filled 2022-09-29: qty 1

## 2022-09-29 MED ORDER — ONDANSETRON HCL 4 MG PO TABS: 4.0000 mg | ORAL_TABLET | Freq: Four times a day (QID) | ORAL | Status: DC | PRN

## 2022-09-29 MED ORDER — IBUPROFEN 800 MG PO TABS: 800.0000 mg | ORAL_TABLET | Freq: Three times a day (TID) | ORAL | 0 refills | Status: AC | PRN

## 2022-09-29 MED ORDER — HYDROMORPHONE HCL 1 MG/ML IJ SOLN
INTRAMUSCULAR | Status: DC | PRN
  Administered 2022-09-29 (×2): .5 mg via INTRAVENOUS

## 2022-09-29 MED ORDER — ROCURONIUM BROMIDE 10 MG/ML (PF) SYRINGE
PREFILLED_SYRINGE | INTRAVENOUS | Status: AC
  Filled 2022-09-29: qty 10

## 2022-09-29 MED ORDER — MENTHOL 3 MG MT LOZG: 1.0000 | LOZENGE | OROMUCOSAL | Status: DC | PRN

## 2022-09-29 MED ORDER — CEFAZOLIN SODIUM-DEXTROSE 2-4 GM/100ML-% IV SOLN
INTRAVENOUS | Status: AC
  Filled 2022-09-29: qty 100

## 2022-09-29 MED ORDER — POVIDONE-IODINE 10 % EX SWAB: 2.0000 | Freq: Once | CUTANEOUS | Status: DC

## 2022-09-29 MED ORDER — HYDROMORPHONE HCL 2 MG/ML IJ SOLN
INTRAMUSCULAR | Status: AC
  Filled 2022-09-29: qty 1

## 2022-09-29 MED ORDER — AMOXICILLIN 500 MG PO CAPS
500.0000 mg | ORAL_CAPSULE | Freq: Three times a day (TID) | ORAL | Status: DC
  Filled 2022-09-29: qty 1

## 2022-09-29 MED ORDER — FENTANYL CITRATE (PF) 250 MCG/5ML IJ SOLN
INTRAMUSCULAR | Status: DC | PRN
  Administered 2022-09-29: 50 ug via INTRAVENOUS
  Administered 2022-09-29: 100 ug via INTRAVENOUS
  Administered 2022-09-29 (×2): 50 ug via INTRAVENOUS

## 2022-09-29 MED ORDER — DIPHENHYDRAMINE HCL 50 MG/ML IJ SOLN
INTRAMUSCULAR | Status: DC | PRN
  Administered 2022-09-29: 12.5 mg via INTRAVENOUS

## 2022-09-29 MED ORDER — HYDROCHLOROTHIAZIDE 25 MG PO TABS: 25.0000 mg | ORAL_TABLET | Freq: Every day | ORAL | Status: DC

## 2022-09-29 MED ORDER — BUPIVACAINE HCL (PF) 0.25 % IJ SOLN
INTRAMUSCULAR | Status: DC | PRN
  Administered 2022-09-29: 14 mL

## 2022-09-29 MED ORDER — PROBIOTIC BLEND PO CAPS: ORAL_CAPSULE | Freq: Every day | ORAL | Status: DC

## 2022-09-29 MED ORDER — ONDANSETRON HCL 4 MG/2ML IJ SOLN
INTRAMUSCULAR | Status: AC
  Filled 2022-09-29: qty 2

## 2022-09-29 MED ORDER — METOPROLOL TARTRATE 25 MG PO TABS: 100.0000 mg | ORAL_TABLET | Freq: Every day | ORAL | Status: DC

## 2022-09-29 MED ORDER — IBUPROFEN 800 MG PO TABS
800.0000 mg | ORAL_TABLET | Freq: Three times a day (TID) | ORAL | Status: DC | PRN
  Administered 2022-09-29: 800 mg via ORAL

## 2022-09-29 MED ORDER — ALUM & MAG HYDROXIDE-SIMETH 200-200-20 MG/5ML PO SUSP: 30.0000 mL | ORAL | Status: DC | PRN

## 2022-09-29 MED ORDER — STERILE WATER FOR IRRIGATION IR SOLN
Status: DC | PRN
  Administered 2022-09-29: 500 mL

## 2022-09-29 MED ORDER — ONDANSETRON HCL 4 MG/2ML IJ SOLN: 4.0000 mg | Freq: Once | INTRAMUSCULAR | Status: DC | PRN

## 2022-09-29 MED ORDER — OXYCODONE-ACETAMINOPHEN 5-325 MG PO TABS: 1.0000 | ORAL_TABLET | ORAL | 0 refills | Status: AC | PRN

## 2022-09-29 MED ORDER — SCOPOLAMINE 1 MG/3DAYS TD PT72
1.0000 | MEDICATED_PATCH | TRANSDERMAL | Status: DC
  Administered 2022-09-29: 1.5 mg via TRANSDERMAL

## 2022-09-29 MED ORDER — HYDROMORPHONE HCL 1 MG/ML IJ SOLN: 0.2500 mg | INTRAMUSCULAR | Status: DC | PRN

## 2022-09-29 MED ORDER — SIMETHICONE 80 MG PO CHEW: 80.0000 mg | CHEWABLE_TABLET | Freq: Four times a day (QID) | ORAL | Status: DC | PRN

## 2022-09-29 MED ORDER — LIDOCAINE HCL (PF) 2 % IJ SOLN
INTRAMUSCULAR | Status: AC
  Filled 2022-09-29: qty 5

## 2022-09-29 MED ORDER — PHENYLEPHRINE HCL (PRESSORS) 10 MG/ML IV SOLN
INTRAVENOUS | Status: AC
  Filled 2022-09-29: qty 1

## 2022-09-29 MED ORDER — GABAPENTIN 300 MG PO CAPS
ORAL_CAPSULE | ORAL | Status: AC
  Filled 2022-09-29: qty 1

## 2022-09-29 MED ORDER — ONDANSETRON HCL 4 MG/2ML IJ SOLN: 4.0000 mg | Freq: Four times a day (QID) | INTRAMUSCULAR | Status: DC | PRN

## 2022-09-29 MED ORDER — OXYCODONE HCL 5 MG/5ML PO SOLN: 5.0000 mg | Freq: Once | ORAL | Status: DC | PRN

## 2022-09-29 MED ORDER — CLORAZEPATE DIPOTASSIUM 7.5 MG PO TABS: 7.5000 mg | ORAL_TABLET | Freq: Two times a day (BID) | ORAL | Status: DC | PRN

## 2022-09-29 MED ORDER — OXYCODONE-ACETAMINOPHEN 5-325 MG PO TABS
ORAL_TABLET | ORAL | Status: AC
  Filled 2022-09-29: qty 1

## 2022-09-29 MED ORDER — LIDOCAINE 2% (20 MG/ML) 5 ML SYRINGE
INTRAMUSCULAR | Status: DC | PRN
  Administered 2022-09-29: 80 mg via INTRAVENOUS

## 2022-09-29 MED ORDER — SUGAMMADEX SODIUM 200 MG/2ML IV SOLN
INTRAVENOUS | Status: DC | PRN
  Administered 2022-09-29: 200 mg via INTRAVENOUS

## 2022-09-29 MED ORDER — PROPOFOL 10 MG/ML IV BOLUS
INTRAVENOUS | Status: AC
  Filled 2022-09-29: qty 20

## 2022-09-29 MED ORDER — GUAIFENESIN 100 MG/5ML PO LIQD: 15.0000 mL | ORAL | Status: DC | PRN

## 2022-09-29 MED ORDER — PROPOFOL 10 MG/ML IV BOLUS
INTRAVENOUS | Status: DC | PRN
  Administered 2022-09-29: 160 mg via INTRAVENOUS

## 2022-09-29 MED ORDER — LACTATED RINGERS IV SOLN: INTRAVENOUS | Status: DC

## 2022-09-29 MED ORDER — DEXAMETHASONE SODIUM PHOSPHATE 10 MG/ML IJ SOLN
INTRAMUSCULAR | Status: AC
  Filled 2022-09-29: qty 1

## 2022-09-29 MED ORDER — PANTOPRAZOLE SODIUM 40 MG PO TBEC: 40.0000 mg | DELAYED_RELEASE_TABLET | Freq: Every day | ORAL | Status: DC

## 2022-09-29 MED ORDER — DEXAMETHASONE SODIUM PHOSPHATE 10 MG/ML IJ SOLN
INTRAMUSCULAR | Status: DC | PRN
  Administered 2022-09-29: 10 mg via INTRAVENOUS

## 2022-09-29 MED ORDER — IBUPROFEN 800 MG PO TABS
ORAL_TABLET | ORAL | Status: AC
  Filled 2022-09-29: qty 1

## 2022-09-29 MED ORDER — GABAPENTIN 300 MG PO CAPS
300.0000 mg | ORAL_CAPSULE | ORAL | Status: AC
  Administered 2022-09-29: 300 mg via ORAL

## 2022-09-29 MED ORDER — ACETAMINOPHEN 500 MG PO TABS
1000.0000 mg | ORAL_TABLET | ORAL | Status: AC
  Administered 2022-09-29: 1000 mg via ORAL

## 2022-09-29 SURGICAL SUPPLY — 78 items
ADH SKN CLS APL DERMABOND .7 (GAUZE/BANDAGES/DRESSINGS) ×2
APL SRG 38 LTWT LNG FL B (MISCELLANEOUS)
APPLICATOR ARISTA FLEXITIP XL (MISCELLANEOUS) IMPLANT
CANNULA CAP OBTURATR AIRSEAL 8 (CAP) ×1 IMPLANT
CANNULA REDUC XI 12-8 STAPL (CANNULA)
CANNULA REDUCER 12-8 DVNC XI (CANNULA) IMPLANT
CATH FOLEY 3WAY  5CC 16FR (CATHETERS) ×2
CATH FOLEY 3WAY 5CC 16FR (CATHETERS) ×2 IMPLANT
CELLS DAT CNTRL 66122 CELL SVR (MISCELLANEOUS) IMPLANT
COVER BACK TABLE 60X90IN (DRAPES) ×2 IMPLANT
COVER TIP SHEARS 8 DVNC (MISCELLANEOUS) ×2 IMPLANT
COVER TIP SHEARS 8MM DA VINCI (MISCELLANEOUS) ×2
DEFOGGER SCOPE WARMER CLEARIFY (MISCELLANEOUS) ×2 IMPLANT
DERMABOND ADVANCED .7 DNX12 (GAUZE/BANDAGES/DRESSINGS) ×2 IMPLANT
DRAPE ARM DVNC X/XI (DISPOSABLE) ×8 IMPLANT
DRAPE COLUMN DVNC XI (DISPOSABLE) ×2 IMPLANT
DRAPE DA VINCI XI ARM (DISPOSABLE) ×8
DRAPE DA VINCI XI COLUMN (DISPOSABLE) ×2
DRAPE UTILITY 15X26 TOWEL STRL (DRAPES) ×2 IMPLANT
DURAPREP 26ML APPLICATOR (WOUND CARE) ×2 IMPLANT
ELECT REM PT RETURN 9FT ADLT (ELECTROSURGICAL) ×2
ELECTRODE REM PT RTRN 9FT ADLT (ELECTROSURGICAL) ×2 IMPLANT
GAUZE 4X4 16PLY ~~LOC~~+RFID DBL (SPONGE) ×2 IMPLANT
GLOVE BIO SURGEON STRL SZ 6.5 (GLOVE) ×7 IMPLANT
HEMOSTAT ARISTA ABSORB 3G PWDR (HEMOSTASIS) IMPLANT
HOLDER FOLEY CATH W/STRAP (MISCELLANEOUS) ×1 IMPLANT
IRRIG SUCT STRYKERFLOW 2 WTIP (MISCELLANEOUS) ×2
IRRIGATION SUCT STRKRFLW 2 WTP (MISCELLANEOUS) ×2 IMPLANT
IV NS 1000ML (IV SOLUTION) ×2
IV NS 1000ML BAXH (IV SOLUTION) ×1 IMPLANT
KIT TURNOVER CYSTO (KITS) ×2 IMPLANT
LEGGING LITHOTOMY PAIR STRL (DRAPES) ×2 IMPLANT
MANIFOLD NEPTUNE II (INSTRUMENTS) ×2 IMPLANT
MANIPULATOR VCARE SML CRV RETR (MISCELLANEOUS) ×1 IMPLANT
NDL INSUFFLATION 14GA 120MM (NEEDLE) ×1 IMPLANT
NEEDLE INSUFFLATION 14GA 120MM (NEEDLE) ×2 IMPLANT
OBTURATOR OPTICAL STANDARD 8MM (TROCAR) ×2
OBTURATOR OPTICAL STND 8 DVNC (TROCAR) ×2
OBTURATOR OPTICALSTD 8 DVNC (TROCAR) ×2 IMPLANT
OCCLUDER COLPOPNEUMO (BALLOONS) ×2 IMPLANT
PACK ROBOT WH (CUSTOM PROCEDURE TRAY) ×2 IMPLANT
PACK ROBOTIC GOWN (GOWN DISPOSABLE) ×2 IMPLANT
PACK TRENDGUARD 450 HYBRID PRO (MISCELLANEOUS) ×1 IMPLANT
PAD OB MATERNITY 4.3X12.25 (PERSONAL CARE ITEMS) ×2 IMPLANT
PAD PREP 24X48 CUFFED NSTRL (MISCELLANEOUS) ×2 IMPLANT
PROTECTOR NERVE ULNAR (MISCELLANEOUS) ×2 IMPLANT
RETRACTOR WND ALEXIS 18 MED (MISCELLANEOUS) IMPLANT
RTRCTR WOUND ALEXIS 18CM MED (MISCELLANEOUS)
RTRCTR WOUND ALEXIS 18CM SML (INSTRUMENTS)
SAVER CELL AAL HAEMONETICS (INSTRUMENTS) IMPLANT
SEAL CANN UNIV 5-8 DVNC XI (MISCELLANEOUS) ×5 IMPLANT
SEAL XI 5MM-8MM UNIVERSAL (MISCELLANEOUS) ×4
SEALER VESSEL DA VINCI XI (MISCELLANEOUS) ×2
SEALER VESSEL EXT DVNC XI (MISCELLANEOUS) ×1 IMPLANT
SET IRRIG Y TYPE TUR BLADDER L (SET/KITS/TRAYS/PACK) IMPLANT
SET TRI-LUMEN FLTR TB AIRSEAL (TUBING) ×1 IMPLANT
SET TUBE FILTERED XL AIRSEAL (SET/KITS/TRAYS/PACK) ×1 IMPLANT
SPIKE FLUID TRANSFER (MISCELLANEOUS) ×3 IMPLANT
STAPLER CANNULA SEAL DVNC XI (STAPLE) IMPLANT
STAPLER CANNULA SEAL XI (STAPLE)
SUT STRATAFIX PDS+0 CT1 9 (SUTURE) IMPLANT
SUT VIC AB 0 CT1 27 (SUTURE)
SUT VIC AB 0 CT1 27XBRD ANBCTR (SUTURE) IMPLANT
SUT VIC AB 4-0 PS2 18 (SUTURE) ×5 IMPLANT
SUT VICRYL 0 UR6 27IN ABS (SUTURE) IMPLANT
SUT VLOC 180 0 9IN  GS21 (SUTURE) ×2
SUT VLOC 180 0 9IN GS21 (SUTURE) ×1 IMPLANT
TIP RUMI ORANGE 6.7MMX12CM (TIP) IMPLANT
TIP UTERINE 5.1X6CM LAV DISP (MISCELLANEOUS) ×1 IMPLANT
TIP UTERINE 6.7X10CM GRN DISP (MISCELLANEOUS) IMPLANT
TIP UTERINE 6.7X6CM WHT DISP (MISCELLANEOUS) IMPLANT
TIP UTERINE 6.7X8CM BLUE DISP (MISCELLANEOUS) IMPLANT
TOWEL OR 17X24 6PK STRL BLUE (TOWEL DISPOSABLE) ×2 IMPLANT
TOWEL OR 17X26 10 PK STRL BLUE (TOWEL DISPOSABLE) ×1 IMPLANT
TRENDGUARD 450 HYBRID PRO PACK (MISCELLANEOUS) ×2
TROCAR PORT AIRSEAL 8X120 (TROCAR) IMPLANT
WATER STERILE IRR 1000ML POUR (IV SOLUTION) ×1 IMPLANT
WATER STERILE IRR 500ML POUR (IV SOLUTION) ×1 IMPLANT

## 2022-09-29 NOTE — Brief Op Note (Signed)
09/29/2022  11:20 AM  PATIENT:  Kendra Coleman  56 y.o. female  PRE-OPERATIVE DIAGNOSIS:  abnormal uterine bleeding, uterine fibroids  POST-OPERATIVE DIAGNOSIS:  abnormal uterine bleeding, uterine fibroids  PROCEDURE:  Procedure(s): XI ROBOTIC ASSISTED LAPAROSCOPIC HYSTERECTOMY AND SALPINGECTOMY (Bilateral)  SURGEON:  Surgeon(s) and Role:    * Bovard-Stuckert, Vinetta Brach, MD - Primary  ASSISTANTS: Gaylord Shih RNFA   ANESTHESIA:   local and general  EBL:  50 mL IVF and uop per anesthesia  BLOOD ADMINISTERED:none  DRAINS: Urinary Catheter (Foley)   LOCAL MEDICATIONS USED:  MARCAINE     SPECIMEN:  Source of Specimen:  uterus, cervix, B fallopian tube segments  DISPOSITION OF SPECIMEN:  PATHOLOGY  COUNTS:  YES  TOURNIQUET:  * No tourniquets in log *  DICTATION: .Other Dictation: Dictation Number [id number]  PLAN OF CARE: Admit for extended recovery  PATIENT DISPOSITION:  PACU - hemodynamically stable.   Delay start of Pharmacological VTE agent (>24hrs) due to surgical blood loss or risk of bleeding: not applicable

## 2022-09-29 NOTE — Progress Notes (Signed)
Gyn Postop  S/p robotic hysterectomy/ B salpingectomy  Feeling well, tolerating po, ambulating.  Pain controlled w po meds.  Awaiting void.  Pt desires discharge to home when voiding.  Will also check labs.  AFVSS, good uop Gen NAD CV RRR Lungs: CTAB Abd: soft, app tender, +BS Inc: C/D/I Ext: sym, NT  Progressing well postoperatively.  Has been ambulating, tolerating po, Pain well controlled.   Will d/c home w stable labs, when tolerating po, ambulating, pain controlled and voiding. Meds called to pharmacy - ibuprofen and percocet  Routine care, progressing well

## 2022-09-29 NOTE — Op Note (Unsigned)
NAME: Kendra Coleman, Kendra Coleman MEDICAL RECORD NO: [id number] ACCOUNT NO: [id number] DATE OF BIRTH: July 26, 1966 FACILITY: [facility] LOCATION: [facility] PHYSICIAN: Janyth Contes, MD  Operative Report   DATE OF PROCEDURE: 09/29/2022  PREOPERATIVE DIAGNOSIS:  Abnormal uterine bleeding, uterine fibroids.  POSTOPERATIVE DIAGNOSIS:  Abnormal uterine bleeding, uterine fibroids.  PROCEDURE:  Robotic-assisted laparoscopic hysterectomy, bilateral salpingectomy.  SURGEON:  Janyth Contes, MD  ASSISTANT:  Sullivan Lone, RNFA, proctored by Dr. Micah Flesher.  ESTIMATED BLOOD LOSS:  Approximately 50 mL  IV FLUIDS AND URINE OUTPUT:  Per anesthesia.  COMPLICATIONS:  None.  PATHOLOGY: Uterus, cervix and bilateral fallopian tube segments.  DESCRIPTION OF PROCEDURE:  After informed consent was reviewed with the patient including risks, benefits and alternatives of the surgical procedure she was transported to the operating room and placed on the table in supine position.  General anesthesia  was induced and found to be adequate.  She was then prepped and draped in the normal sterile fashion.  A KOH ring and the manipulator was placed in her uterus.  Gloves and gown were changed prior to any of this, an appropriate timeout was performed.   The KOH ring was placed around her cervix.  Manipulator in her uterus.  Gloves and gown were changed.  Attention was turned to the abdominal portion of the case.  An 8 mm infraumbilical incision was made at the level of her previous incision for tubal  ligation.  The fascia was cleared off with a hemostat using a Veress needle, pneumoperitoneum was obtained after passing the hanging drop test with an opening pressure of 0 mmHg.  After 3 liters were instilled a trocar was placed under direct  visualization.  A brief pelvic survey was performed revealed normal uterus with a pedunculated fibroid at the fundus on the back side.  Attention was turned to the right and left.  Accessory ports were placed under direct visualization.  The left tube was  elevated and using the vessel sealer was excised to the level of the cornu.  She had previously had a tubal ligation, so there was thin tissue, but this was able to be connected. The round ligaments were ligated. The utero-ovarian ligament were also  ligated and found to be hemostatic.  The cardinal ligaments were ligated.  The bladder flap was created in part.  Attention was then turned to the right side, which, in a similar fashion, the tube was grasped at the fimbriated end and to the level of  where the tubal ligation had been performed before the tube was excised.  The uteroovarian ligament was ligated.  The round ligament was ligated.  The cardinal ligaments were ligated using the vessel sealer.  Bladder flap was created to the midline.  The  instruments were switched out using the monopolar scissors.  The ring was visualized prior to creating the bladder flap on the left side.  It was noted that the KOH ring was not well applied.  This was changed out for [facility]. The ring of the VCare was  visualized and the vagina was ligated off the uterine arteries bilaterally to expose the ring.  This was followed around circumferentially. The uterus was then removed in entirety as well as the tubal segment was passed to the staff and removed through  the vagina.  The irrigation was performed.  The cuff was closed with a single suture of V-lock with some back stitching on the side.  The left aspect of the incision.  Irrigation again was performed and pedicles were  found to be hemostatic.  The ureters  were also identified and traced and found to be within normal limits.  The robot was undocked.  The trocars were removed under direct visualization.  The cuff was inspected and found to be well closed.  The ports were closed by the RNFA with 4-0 Vicryl  and Dermabond.  The patient tolerated the procedure well.  Sponge, lap and needle counts  were correct x2 per the operating staff.   PUS D: 09/29/2022 11:38:32 am T: 09/29/2022 6:01:00 pm  JOB: [id number]/ [id number]

## 2022-09-29 NOTE — Anesthesia Postprocedure Evaluation (Signed)
Anesthesia Post Note  Patient: Kendra Coleman  Procedure(s) Performed: XI ROBOTIC ASSISTED LAPAROSCOPIC HYSTERECTOMY AND SALPINGECTOMY (Bilateral: Abdomen)     Patient location during evaluation: PACU Anesthesia Type: General Level of consciousness: awake and alert and oriented Pain management: pain level controlled Vital Signs Assessment: post-procedure vital signs reviewed and stable Respiratory status: spontaneous breathing, nonlabored ventilation and respiratory function stable Cardiovascular status: blood pressure returned to baseline and stable Postop Assessment: no apparent nausea or vomiting Anesthetic complications: no   No notable events documented.  Last Vitals:  Vitals:   09/29/22 1230 09/29/22 1245  BP: (!) 136/92 115/79  Pulse: 74 74  Resp: 10 10  Temp:  36.8 C  SpO2: 93% 92%    Last Pain:  Vitals:   09/29/22 1230  TempSrc:   PainSc: 0-No pain                 Stanly Si A.

## 2022-09-29 NOTE — Interval H&P Note (Signed)
History and Physical Interval Note:  09/29/2022 8:15 AM  [name]  has presented today for surgery, with the diagnosis of abnormal uterine bleeding, uterine fibroids.  The various methods of treatment have been discussed with the patient and family. After consideration of risks, benefits and other options for treatment, the patient has consented to  Procedure(s): XI ROBOTIC ASSISTED LAPAROSCOPIC HYSTERECTOMY AND SALPINGECTOMY (Bilateral) CYSTOSCOPY (N/A) as a surgical intervention.  The patient's history has been reviewed, patient examined, no change in status, stable for surgery.  I have reviewed the patient's chart and labs.  Questions were answered to the patient's satisfaction.     Kendra Coleman

## 2022-09-29 NOTE — Transfer of Care (Signed)
Immediate Anesthesia Transfer of Care Note  Patient: Kendra Coleman  Procedure(s) Performed: Procedure(s) (LRB): XI ROBOTIC ASSISTED LAPAROSCOPIC HYSTERECTOMY AND SALPINGECTOMY (Bilateral)  Patient Location: PACU  Anesthesia Type: General  Level of Consciousness: awake, alert  and oriented  Airway & Oxygen Therapy: Patient Spontanous Breathing and Patient connected to face mask oxygen, oral airway removed.  Post-op Assessment: Report given to PACU RN and Post -op Vital signs reviewed and stable  Post vital signs: Reviewed and stable  Complications: No apparent anesthesia complications  Last Vitals:  Vitals Value Taken Time  BP 116/70 09/29/22 1145  Temp 36.4 C 09/29/22 1139  Pulse 80 09/29/22 1149  Resp 13 09/29/22 1149  SpO2 89 % 09/29/22 1149  Vitals shown include unvalidated device data.  Last Pain:  Vitals:   09/29/22 0722  TempSrc: Oral  PainSc: 3       Patients Stated Pain Goal: 3 (34/28/76 8115)  Complications: No notable events documented.

## 2022-09-29 NOTE — Anesthesia Procedure Notes (Signed)
Procedure Name: Intubation Date/Time: 09/29/2022 8:57 AM  Performed by: Clearnce Sorrel, CRNAPre-anesthesia Checklist: Patient identified, Emergency Drugs available, Suction available and Patient being monitored Patient Re-evaluated:Patient Re-evaluated prior to induction Oxygen Delivery Method: Circle System Utilized Preoxygenation: Pre-oxygenation with 100% oxygen Induction Type: IV induction Ventilation: Mask ventilation without difficulty Laryngoscope Size: Mac and 3 Grade View: Grade I Tube type: Oral Tube size: 7.0 mm Number of attempts: 1 Airway Equipment and Method: Stylet and Oral airway Placement Confirmation: ETT inserted through vocal cords under direct vision, positive ETCO2 and breath sounds checked- equal and bilateral Secured at: 22 cm Tube secured with: Tape Dental Injury: Teeth and Oropharynx as per pre-operative assessment

## 2022-09-29 NOTE — Interval H&P Note (Signed)
History and Physical Interval Note:  09/29/2022 8:15 AM  Kendra Coleman  has presented today for surgery, with the diagnosis of abnormal uterine bleeding, uterine fibroids.  The various methods of treatment have been discussed with the patient and family. After consideration of risks, benefits and other options for treatment, the patient has consented to  Procedure(s): XI ROBOTIC ASSISTED LAPAROSCOPIC HYSTERECTOMY AND SALPINGECTOMY (Bilateral) CYSTOSCOPY (N/A) as a surgical intervention.  The patient's history has been reviewed, patient examined, no change in status, stable for surgery.  I have reviewed the patient's chart and labs.  Questions were answered to the patient's satisfaction.     Kendra Coleman

## 2022-10-02 LAB — SURGICAL PATHOLOGY

## 2022-10-03 ENCOUNTER — Encounter (HOSPITAL_BASED_OUTPATIENT_CLINIC_OR_DEPARTMENT_OTHER): Payer: Self-pay | Admitting: Obstetrics and Gynecology

## 2022-10-03 NOTE — Discharge Summary (Signed)
Pt admitted for robotic laparoscopic hysterectomy, admitted for extended observation.  By 5pm pt was ambulating, voiding, tolerating po and pain was controlled.  Her labs were stable.  She was discharge with postoperative precautions, percocet and motrin.  She is to follow up in 2 and 6 weeks.

## 2022-10-05 DIAGNOSIS — R42 Dizziness and giddiness: Secondary | ICD-10-CM | POA: Diagnosis not present

## 2022-11-15 DIAGNOSIS — R300 Dysuria: Secondary | ICD-10-CM | POA: Diagnosis not present

## 2022-11-27 DIAGNOSIS — R300 Dysuria: Secondary | ICD-10-CM | POA: Diagnosis not present

## 2022-12-05 ENCOUNTER — Other Ambulatory Visit: Payer: Self-pay | Admitting: Physician Assistant

## 2022-12-05 DIAGNOSIS — Z1231 Encounter for screening mammogram for malignant neoplasm of breast: Secondary | ICD-10-CM

## 2022-12-25 ENCOUNTER — Ambulatory Visit (INDEPENDENT_AMBULATORY_CARE_PROVIDER_SITE_OTHER): Payer: 59

## 2022-12-25 ENCOUNTER — Ambulatory Visit (INDEPENDENT_AMBULATORY_CARE_PROVIDER_SITE_OTHER): Payer: 59 | Admitting: Physician Assistant

## 2022-12-25 ENCOUNTER — Encounter: Payer: Self-pay | Admitting: Physician Assistant

## 2022-12-25 VITALS — Ht 67.0 in | Wt 205.0 lb

## 2022-12-25 DIAGNOSIS — M7582 Other shoulder lesions, left shoulder: Secondary | ICD-10-CM | POA: Insufficient documentation

## 2022-12-25 DIAGNOSIS — G8929 Other chronic pain: Secondary | ICD-10-CM | POA: Diagnosis not present

## 2022-12-25 DIAGNOSIS — M25512 Pain in left shoulder: Secondary | ICD-10-CM | POA: Diagnosis not present

## 2022-12-25 MED ORDER — BUPIVACAINE HCL 0.25 % IJ SOLN
2.0000 mL | INTRAMUSCULAR | Status: AC | PRN
  Administered 2022-12-25: 2 mL via INTRA_ARTICULAR

## 2022-12-25 MED ORDER — METHYLPREDNISOLONE ACETATE 40 MG/ML IJ SUSP
80.0000 mg | INTRAMUSCULAR | Status: AC | PRN
  Administered 2022-12-25: 80 mg via INTRA_ARTICULAR

## 2022-12-25 MED ORDER — LIDOCAINE HCL 1 % IJ SOLN
2.0000 mL | INTRAMUSCULAR | Status: AC | PRN
  Administered 2022-12-25: 2 mL

## 2022-12-25 NOTE — Progress Notes (Signed)
Office Visit Note   Patient: Kendra Coleman           Date of Birth: August 29, 1966           MRN: [id number] Visit Date: 12/25/2022              Requested by: Lennie Odor, PA [address] [place],  [place] [address] PCP: Lennie Odor, PA   Assessment & Plan: Visit Diagnoses:  1. Chronic left shoulder pain   2. Rotator cuff tendinitis, left     Plan: Pleasant 57 year old woman with a several month history of left shoulder pain and decreased motion.  Denies any particular injury but she [place] the [place] and was doing a lot of reaching behind her with the left arm.  She is right-hand dominant.  She is now in [place] for a bit and has noticed decreased motion with this left shoulder especially with going overhead and behind her back.  Sometimes if she sits for a while she gets some tingling but denies any neck pain.  Findings consistent with rotator cuff tendinitis and I also have some concerns for adhesive capsulitis.  Would like her to engage with physical therapy.  Also will go forward with an injection into her subacromial space today.  Follow-Up Instructions: 6 weeks  Orders:  Orders Placed This Encounter  Procedures  . XR Shoulder Left  . Ambulatory referral to Physical Therapy   No orders of the defined types were placed in this encounter.     Procedures: Large Joint Inj: L subacromial bursa on 12/25/2022 11:53 AM Indications: diagnostic evaluation and pain Details: 25 G 1.5 in needle, posterior approach  Arthrogram: No  Medications: 2 mL lidocaine 1 %; 80 mg methylPREDNISolone acetate 40 MG/ML; 2 mL bupivacaine 0.25 % Outcome: tolerated well, no immediate complications Procedure, treatment alternatives, risks and benefits explained, specific risks discussed. Consent was given by the patient.     Clinical Data: No additional findings.   Subjective: Chief Complaint  Patient presents with  . Left Shoulder -  Pain    HPI pleasant 57 year old woman with a several month history of left shoulder pain and loss of motion denies any injury but has had some repetitive rotation activities while she is on vacation Review of Systems  All other systems reviewed and are negative.    Objective: Vital Signs: Ht '5\' 7"'$  (1.702 m)   Wt 205 lb (93 kg)   BMI 32.11 kg/m   Physical Exam Constitutional:      Appearance: Normal appearance.  Pulmonary:     Effort: Pulmonary effort is normal.  Skin:    General: Skin is warm and dry.  Neurological:     Mental Status: She is alert.    Ortho Exam Left shoulder she does have some limitation she is lacking about 20 degrees of forward elevation even passively.  She can internally rotate behind her back to her pocket line.  She good has good strength with abduction external and internal rotation her sensation is intact.  No pain reproduced with range of motion of her neck.  She does have some mild impingement findings Specialty Comments:  No specialty comments available.  Imaging: XR Shoulder Left  Result Date: 12/25/2022 Radiographs of her left shoulder were obtained.  No evidence of significant degenerative changes humeral head is well centered in the glenoid.  No evidence of any fracture.    PMFS History: Patient Active Problem  List   Diagnosis Date Noted  . Rotator cuff tendinitis, left 12/25/2022  . S/P laparoscopic hysterectomy 09/29/2022  . 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 Genetic testing 10/04/2020  . Family history of gene mutation   . Family history of multiple myeloma   . Family history of prostate cancer   . Family history of breast cancer   . Family history of non-Hodgkin's lymphoma   . S/P laparoscopic cholecystectomy 04/13/2017   Past Medical History:  Diagnosis Date  . Anxiety    Follows with Ricka Burdock, PA @ [facility]  . Family history of breast cancer   . Family history of gene mutation   . Family history  of multiple myeloma   . Family history of non-Hodgkin's lymphoma   . Family history of prostate cancer   . Fibroid, uterine   . GERD (gastroesophageal reflux disease)    Currently taking Omeprazole as of 09/20/2022.  Marland Kitchen History of kidney stones    around 2016  . Hypertension    Follows with Ricka Burdock, [place] @ [facility]  . Low back pain 2023   s/p 3 steroid injections  . Monoallelic mutation of MUTYH gene   . Shakiness 2023   Pt states she has recently had some episodes of feeling shaky if she gets hungry or hasn't eaten a lot that day.    Family History  Problem Relation Age of Onset  . Breast cancer Maternal Aunt        dx early 67s  . Breast cancer Paternal Aunt        dx 71s  . Cancer Paternal Aunt 59       "rare and aggressive" gyn cancer  . Other Paternal Aunt        MUTYH gene mutation  . Breast cancer Paternal Grandmother        dx 43s  . Multiple myeloma Father 72  . Prostate cancer Father 38  . Other Father        MUTYH gene mutation c.[id number]>A  . Non-Hodgkin's lymphoma Maternal Grandmother 76  . Other Cousin        MUTYH gene mutation c.1187G>A (paternal first cousin)    Past Surgical History:  Procedure Laterality Date  . APPENDECTOMY  1994  . CHOLECYSTECTOMY N/A 04/13/2017   Procedure: LAPAROSCOPIC CHOLECYSTECTOMY;  Surgeon: Ralene Ok, MD;  Location: [facility];  Service: General;  Laterality: N/A;  . ROBOTIC ASSISTED LAPAROSCOPIC HYSTERECTOMY AND SALPINGECTOMY Bilateral 09/29/2022   Procedure: XI ROBOTIC ASSISTED LAPAROSCOPIC HYSTERECTOMY AND SALPINGECTOMY;  Surgeon: Janyth Contes, MD;  Location: [facility];  Service: Gynecology;  Laterality: Bilateral;  . TUBAL LIGATION  2003   Social History   Occupational History  . Not on file  Tobacco Use  . Smoking status: Former    Packs/day: 0.75    Years: 10.00    Total pack years: 7.50    Types: Cigarettes    Quit date: 11/20/1997    Years since quitting: 25.1  . Smokeless  tobacco: Never  Vaping Use  . Vaping Use: Never used  Substance and Sexual Activity  . Alcohol use: Yes    Comment: rarely  . Drug use: No  . Sexual activity: Not on file    Comment: tubal ligation

## 2023-01-04 DIAGNOSIS — R7303 Prediabetes: Secondary | ICD-10-CM | POA: Diagnosis not present

## 2023-01-04 DIAGNOSIS — F411 Generalized anxiety disorder: Secondary | ICD-10-CM | POA: Diagnosis not present

## 2023-01-04 DIAGNOSIS — I10 Essential (primary) hypertension: Secondary | ICD-10-CM | POA: Diagnosis not present

## 2023-01-04 DIAGNOSIS — K219 Gastro-esophageal reflux disease without esophagitis: Secondary | ICD-10-CM | POA: Diagnosis not present

## 2023-01-04 DIAGNOSIS — Z79899 Other long term (current) drug therapy: Secondary | ICD-10-CM | POA: Diagnosis not present

## 2023-01-04 DIAGNOSIS — Z1331 Encounter for screening for depression: Secondary | ICD-10-CM | POA: Diagnosis not present

## 2023-01-04 DIAGNOSIS — E6609 Other obesity due to excess calories: Secondary | ICD-10-CM | POA: Diagnosis not present

## 2023-01-04 DIAGNOSIS — R5383 Other fatigue: Secondary | ICD-10-CM | POA: Diagnosis not present

## 2023-01-04 DIAGNOSIS — E8889 Other specified metabolic disorders: Secondary | ICD-10-CM | POA: Diagnosis not present

## 2023-01-04 DIAGNOSIS — Z6832 Body mass index (BMI) 32.0-32.9, adult: Secondary | ICD-10-CM | POA: Diagnosis not present

## 2023-01-09 ENCOUNTER — Ambulatory Visit: Payer: 59 | Admitting: Physical Therapy

## 2023-01-18 DIAGNOSIS — I10 Essential (primary) hypertension: Secondary | ICD-10-CM | POA: Diagnosis not present

## 2023-01-18 DIAGNOSIS — E6609 Other obesity due to excess calories: Secondary | ICD-10-CM | POA: Diagnosis not present

## 2023-01-18 DIAGNOSIS — Z6833 Body mass index (BMI) 33.0-33.9, adult: Secondary | ICD-10-CM | POA: Diagnosis not present

## 2023-01-18 DIAGNOSIS — K219 Gastro-esophageal reflux disease without esophagitis: Secondary | ICD-10-CM | POA: Diagnosis not present

## 2023-01-18 DIAGNOSIS — R7303 Prediabetes: Secondary | ICD-10-CM | POA: Diagnosis not present

## 2023-01-18 DIAGNOSIS — F411 Generalized anxiety disorder: Secondary | ICD-10-CM | POA: Diagnosis not present

## 2023-01-23 ENCOUNTER — Ambulatory Visit
Admission: RE | Admit: 2023-01-23 | Discharge: 2023-01-23 | Disposition: A | Discharge status: Home / Self Care | Payer: 59 | Source: Ambulatory Visit | Attending: Physician Assistant | Admitting: Physician Assistant

## 2023-01-23 DIAGNOSIS — Z1231 Encounter for screening mammogram for malignant neoplasm of breast: Secondary | ICD-10-CM | POA: Diagnosis not present

## 2023-02-01 DIAGNOSIS — F411 Generalized anxiety disorder: Secondary | ICD-10-CM | POA: Diagnosis not present

## 2023-02-01 DIAGNOSIS — R7303 Prediabetes: Secondary | ICD-10-CM | POA: Diagnosis not present

## 2023-02-01 DIAGNOSIS — E6609 Other obesity due to excess calories: Secondary | ICD-10-CM | POA: Diagnosis not present

## 2023-02-01 DIAGNOSIS — Z6833 Body mass index (BMI) 33.0-33.9, adult: Secondary | ICD-10-CM | POA: Diagnosis not present

## 2023-02-01 DIAGNOSIS — I10 Essential (primary) hypertension: Secondary | ICD-10-CM | POA: Diagnosis not present

## 2023-02-01 DIAGNOSIS — K219 Gastro-esophageal reflux disease without esophagitis: Secondary | ICD-10-CM | POA: Diagnosis not present

## 2023-03-08 DIAGNOSIS — L820 Inflamed seborrheic keratosis: Secondary | ICD-10-CM | POA: Diagnosis not present

## 2023-03-08 DIAGNOSIS — L538 Other specified erythematous conditions: Secondary | ICD-10-CM | POA: Diagnosis not present

## 2023-03-08 DIAGNOSIS — L814 Other melanin hyperpigmentation: Secondary | ICD-10-CM | POA: Diagnosis not present

## 2023-03-08 DIAGNOSIS — Z7189 Other specified counseling: Secondary | ICD-10-CM | POA: Diagnosis not present

## 2023-03-08 DIAGNOSIS — L573 Poikiloderma of Civatte: Secondary | ICD-10-CM | POA: Diagnosis not present

## 2023-03-08 DIAGNOSIS — L718 Other rosacea: Secondary | ICD-10-CM | POA: Diagnosis not present

## 2023-03-08 DIAGNOSIS — D225 Melanocytic nevi of trunk: Secondary | ICD-10-CM | POA: Diagnosis not present

## 2023-03-26 DIAGNOSIS — F411 Generalized anxiety disorder: Secondary | ICD-10-CM | POA: Diagnosis not present

## 2023-03-26 DIAGNOSIS — R7303 Prediabetes: Secondary | ICD-10-CM | POA: Diagnosis not present

## 2023-03-26 DIAGNOSIS — E6609 Other obesity due to excess calories: Secondary | ICD-10-CM | POA: Diagnosis not present

## 2023-03-26 DIAGNOSIS — I10 Essential (primary) hypertension: Secondary | ICD-10-CM | POA: Diagnosis not present

## 2023-03-26 DIAGNOSIS — Z6833 Body mass index (BMI) 33.0-33.9, adult: Secondary | ICD-10-CM | POA: Diagnosis not present

## 2023-03-26 DIAGNOSIS — K219 Gastro-esophageal reflux disease without esophagitis: Secondary | ICD-10-CM | POA: Diagnosis not present

## 2023-05-07 DIAGNOSIS — R7303 Prediabetes: Secondary | ICD-10-CM | POA: Diagnosis not present

## 2023-05-07 DIAGNOSIS — R509 Fever, unspecified: Secondary | ICD-10-CM | POA: Diagnosis not present

## 2023-05-07 DIAGNOSIS — I10 Essential (primary) hypertension: Secondary | ICD-10-CM | POA: Diagnosis not present

## 2023-05-07 DIAGNOSIS — J029 Acute pharyngitis, unspecified: Secondary | ICD-10-CM | POA: Diagnosis not present

## 2023-06-20 DIAGNOSIS — R5383 Other fatigue: Secondary | ICD-10-CM | POA: Diagnosis not present

## 2023-06-20 DIAGNOSIS — K219 Gastro-esophageal reflux disease without esophagitis: Secondary | ICD-10-CM | POA: Diagnosis not present

## 2023-06-20 DIAGNOSIS — Z79899 Other long term (current) drug therapy: Secondary | ICD-10-CM | POA: Diagnosis not present

## 2023-06-20 DIAGNOSIS — F411 Generalized anxiety disorder: Secondary | ICD-10-CM | POA: Diagnosis not present

## 2023-06-20 DIAGNOSIS — R7303 Prediabetes: Secondary | ICD-10-CM | POA: Diagnosis not present

## 2023-06-20 DIAGNOSIS — Z1321 Encounter for screening for nutritional disorder: Secondary | ICD-10-CM | POA: Diagnosis not present

## 2023-06-20 DIAGNOSIS — Z6833 Body mass index (BMI) 33.0-33.9, adult: Secondary | ICD-10-CM | POA: Diagnosis not present

## 2023-06-20 DIAGNOSIS — E6609 Other obesity due to excess calories: Secondary | ICD-10-CM | POA: Diagnosis not present

## 2023-06-20 DIAGNOSIS — M25571 Pain in right ankle and joints of right foot: Secondary | ICD-10-CM | POA: Diagnosis not present

## 2023-06-20 DIAGNOSIS — I10 Essential (primary) hypertension: Secondary | ICD-10-CM | POA: Diagnosis not present

## 2023-06-27 DIAGNOSIS — S93401A Sprain of unspecified ligament of right ankle, initial encounter: Secondary | ICD-10-CM | POA: Diagnosis not present

## 2023-07-03 DIAGNOSIS — M5416 Radiculopathy, lumbar region: Secondary | ICD-10-CM | POA: Diagnosis not present

## 2023-08-08 DIAGNOSIS — R002 Palpitations: Secondary | ICD-10-CM | POA: Diagnosis not present

## 2023-08-08 DIAGNOSIS — K219 Gastro-esophageal reflux disease without esophagitis: Secondary | ICD-10-CM | POA: Diagnosis not present

## 2023-08-08 DIAGNOSIS — E7800 Pure hypercholesterolemia, unspecified: Secondary | ICD-10-CM | POA: Diagnosis not present

## 2023-08-08 DIAGNOSIS — Z23 Encounter for immunization: Secondary | ICD-10-CM | POA: Diagnosis not present

## 2023-08-08 DIAGNOSIS — I10 Essential (primary) hypertension: Secondary | ICD-10-CM | POA: Diagnosis not present

## 2023-08-08 DIAGNOSIS — Z0000 Encounter for general adult medical examination without abnormal findings: Secondary | ICD-10-CM | POA: Diagnosis not present

## 2023-08-08 DIAGNOSIS — R7303 Prediabetes: Secondary | ICD-10-CM | POA: Diagnosis not present

## 2023-08-08 DIAGNOSIS — F411 Generalized anxiety disorder: Secondary | ICD-10-CM | POA: Diagnosis not present

## 2023-09-12 DIAGNOSIS — Z6833 Body mass index (BMI) 33.0-33.9, adult: Secondary | ICD-10-CM | POA: Diagnosis not present

## 2023-09-12 DIAGNOSIS — Z79899 Other long term (current) drug therapy: Secondary | ICD-10-CM | POA: Diagnosis not present

## 2023-09-12 DIAGNOSIS — R5383 Other fatigue: Secondary | ICD-10-CM | POA: Diagnosis not present

## 2023-09-12 DIAGNOSIS — K219 Gastro-esophageal reflux disease without esophagitis: Secondary | ICD-10-CM | POA: Diagnosis not present

## 2023-09-12 DIAGNOSIS — F411 Generalized anxiety disorder: Secondary | ICD-10-CM | POA: Diagnosis not present

## 2023-09-12 DIAGNOSIS — I10 Essential (primary) hypertension: Secondary | ICD-10-CM | POA: Diagnosis not present

## 2023-09-12 DIAGNOSIS — R7303 Prediabetes: Secondary | ICD-10-CM | POA: Diagnosis not present

## 2023-09-12 DIAGNOSIS — E6609 Other obesity due to excess calories: Secondary | ICD-10-CM | POA: Diagnosis not present

## 2023-09-28 DIAGNOSIS — M5416 Radiculopathy, lumbar region: Secondary | ICD-10-CM | POA: Diagnosis not present

## 2023-10-04 DIAGNOSIS — M5416 Radiculopathy, lumbar region: Secondary | ICD-10-CM | POA: Diagnosis not present

## 2023-10-23 DIAGNOSIS — I10 Essential (primary) hypertension: Secondary | ICD-10-CM | POA: Diagnosis not present

## 2023-10-23 DIAGNOSIS — R7303 Prediabetes: Secondary | ICD-10-CM | POA: Diagnosis not present

## 2023-10-23 DIAGNOSIS — R5383 Other fatigue: Secondary | ICD-10-CM | POA: Diagnosis not present

## 2023-10-23 DIAGNOSIS — Z6833 Body mass index (BMI) 33.0-33.9, adult: Secondary | ICD-10-CM | POA: Diagnosis not present

## 2023-10-23 DIAGNOSIS — Z79899 Other long term (current) drug therapy: Secondary | ICD-10-CM | POA: Diagnosis not present

## 2023-10-23 DIAGNOSIS — E6609 Other obesity due to excess calories: Secondary | ICD-10-CM | POA: Diagnosis not present

## 2023-10-23 DIAGNOSIS — F411 Generalized anxiety disorder: Secondary | ICD-10-CM | POA: Diagnosis not present

## 2023-10-23 DIAGNOSIS — K219 Gastro-esophageal reflux disease without esophagitis: Secondary | ICD-10-CM | POA: Diagnosis not present

## 2023-11-09 DIAGNOSIS — E7800 Pure hypercholesterolemia, unspecified: Secondary | ICD-10-CM | POA: Diagnosis not present

## 2023-12-20 DIAGNOSIS — Z01419 Encounter for gynecological examination (general) (routine) without abnormal findings: Secondary | ICD-10-CM | POA: Diagnosis not present

## 2023-12-20 DIAGNOSIS — Z1389 Encounter for screening for other disorder: Secondary | ICD-10-CM | POA: Diagnosis not present

## 2023-12-20 DIAGNOSIS — Z01411 Encounter for gynecological examination (general) (routine) with abnormal findings: Secondary | ICD-10-CM | POA: Diagnosis not present

## 2023-12-20 DIAGNOSIS — Z130 Encounter for screening for diseases of the blood and blood-forming organs and certain disorders involving the immune mechanism: Secondary | ICD-10-CM | POA: Diagnosis not present

## 2023-12-20 DIAGNOSIS — Z780 Asymptomatic menopausal state: Secondary | ICD-10-CM | POA: Diagnosis not present

## 2023-12-20 DIAGNOSIS — B002 Herpesviral gingivostomatitis and pharyngotonsillitis: Secondary | ICD-10-CM | POA: Diagnosis not present

## 2023-12-20 DIAGNOSIS — R6882 Decreased libido: Secondary | ICD-10-CM | POA: Diagnosis not present

## 2023-12-24 ENCOUNTER — Other Ambulatory Visit: Payer: Self-pay | Admitting: Physician Assistant

## 2023-12-24 DIAGNOSIS — Z1231 Encounter for screening mammogram for malignant neoplasm of breast: Secondary | ICD-10-CM

## 2024-01-24 ENCOUNTER — Ambulatory Visit
Admission: RE | Admit: 2024-01-24 | Discharge: 2024-01-24 | Disposition: A | Discharge status: Home / Self Care | Payer: 59 | Source: Ambulatory Visit | Attending: Physician Assistant | Admitting: Physician Assistant

## 2024-01-24 DIAGNOSIS — Z1231 Encounter for screening mammogram for malignant neoplasm of breast: Secondary | ICD-10-CM | POA: Diagnosis not present

## 2024-02-15 DIAGNOSIS — E7800 Pure hypercholesterolemia, unspecified: Secondary | ICD-10-CM | POA: Diagnosis not present

## 2024-02-25 DIAGNOSIS — I10 Essential (primary) hypertension: Secondary | ICD-10-CM | POA: Diagnosis not present

## 2024-02-25 DIAGNOSIS — R5383 Other fatigue: Secondary | ICD-10-CM | POA: Diagnosis not present

## 2024-02-25 DIAGNOSIS — R7303 Prediabetes: Secondary | ICD-10-CM | POA: Diagnosis not present

## 2024-02-25 DIAGNOSIS — Z9189 Other specified personal risk factors, not elsewhere classified: Secondary | ICD-10-CM | POA: Diagnosis not present

## 2024-02-25 DIAGNOSIS — K219 Gastro-esophageal reflux disease without esophagitis: Secondary | ICD-10-CM | POA: Diagnosis not present

## 2024-02-25 DIAGNOSIS — F411 Generalized anxiety disorder: Secondary | ICD-10-CM | POA: Diagnosis not present

## 2024-02-25 DIAGNOSIS — E6609 Other obesity due to excess calories: Secondary | ICD-10-CM | POA: Diagnosis not present

## 2024-02-25 DIAGNOSIS — Z6831 Body mass index (BMI) 31.0-31.9, adult: Secondary | ICD-10-CM | POA: Diagnosis not present

## 2024-03-12 DIAGNOSIS — R21 Rash and other nonspecific skin eruption: Secondary | ICD-10-CM | POA: Diagnosis not present

## 2024-04-23 DIAGNOSIS — E1169 Type 2 diabetes mellitus with other specified complication: Secondary | ICD-10-CM | POA: Diagnosis not present

## 2024-04-23 DIAGNOSIS — R300 Dysuria: Secondary | ICD-10-CM | POA: Diagnosis not present

## 2024-04-23 DIAGNOSIS — Z87442 Personal history of urinary calculi: Secondary | ICD-10-CM | POA: Diagnosis not present

## 2024-04-23 DIAGNOSIS — I10 Essential (primary) hypertension: Secondary | ICD-10-CM | POA: Diagnosis not present

## 2024-08-11 DIAGNOSIS — R42 Dizziness and giddiness: Secondary | ICD-10-CM | POA: Diagnosis not present

## 2024-08-11 DIAGNOSIS — K219 Gastro-esophageal reflux disease without esophagitis: Secondary | ICD-10-CM | POA: Diagnosis not present

## 2024-08-11 DIAGNOSIS — E1169 Type 2 diabetes mellitus with other specified complication: Secondary | ICD-10-CM | POA: Diagnosis not present

## 2024-08-11 DIAGNOSIS — I10 Essential (primary) hypertension: Secondary | ICD-10-CM | POA: Diagnosis not present

## 2024-08-11 DIAGNOSIS — Z0000 Encounter for general adult medical examination without abnormal findings: Secondary | ICD-10-CM | POA: Diagnosis not present

## 2024-08-11 DIAGNOSIS — F411 Generalized anxiety disorder: Secondary | ICD-10-CM | POA: Diagnosis not present

## 2024-08-11 DIAGNOSIS — E7800 Pure hypercholesterolemia, unspecified: Secondary | ICD-10-CM | POA: Diagnosis not present

## 2024-12-25 ENCOUNTER — Other Ambulatory Visit: Payer: Self-pay | Admitting: Physician Assistant

## 2024-12-25 DIAGNOSIS — Z1231 Encounter for screening mammogram for malignant neoplasm of breast: Secondary | ICD-10-CM

## 2024-12-25 NOTE — Progress Notes (Addendum)
 Kendra Coleman                                          MRN: [id number]   12/25/2024   The [facility] Quality Team Specialist reviewed this patient medical record for the purposes of chart review for care gap closure. The following were reviewed: chart review for care gap closure-diabetic eye exam.    [facility] Quality Team

## 2025-01-27 ENCOUNTER — Ambulatory Visit
